# Patient Record
Sex: Male | Born: 1955 | Race: White | Hispanic: No | Marital: Married | Smoking: Never smoker
Health system: Southern US, Community
[De-identification: ages and names within clinical notes are randomized; demographics above are authoritative.]

## PROBLEM LIST (undated history)

## (undated) DIAGNOSIS — I10 Essential (primary) hypertension: Secondary | ICD-10-CM

## (undated) DIAGNOSIS — M109 Gout, unspecified: Secondary | ICD-10-CM

## (undated) DIAGNOSIS — I2510 Atherosclerotic heart disease of native coronary artery without angina pectoris: Secondary | ICD-10-CM

## (undated) DIAGNOSIS — E782 Mixed hyperlipidemia: Secondary | ICD-10-CM

## (undated) DIAGNOSIS — G5603 Carpal tunnel syndrome, bilateral upper limbs: Secondary | ICD-10-CM

## (undated) DIAGNOSIS — M1990 Unspecified osteoarthritis, unspecified site: Secondary | ICD-10-CM

## (undated) HISTORY — PX: TRIGGER FINGER RELEASE: SHX641

## (undated) HISTORY — DX: Atherosclerotic heart disease of native coronary artery without angina pectoris: I25.10

## (undated) HISTORY — DX: Mixed hyperlipidemia: E78.2

## (undated) HISTORY — DX: Essential (primary) hypertension: I10

---

## 1975-03-29 HISTORY — PX: VASECTOMY: SHX75

## 2001-03-29 ENCOUNTER — Encounter: Payer: Self-pay | Admitting: Pediatrics

## 2001-03-29 ENCOUNTER — Inpatient Hospital Stay (HOSPITAL_COMMUNITY): Admission: EM | Admit: 2001-03-29 | Discharge: 2001-03-30 | Payer: Self-pay | Admitting: Emergency Medicine

## 2003-02-14 ENCOUNTER — Ambulatory Visit (HOSPITAL_COMMUNITY): Admission: RE | Admit: 2003-02-14 | Discharge: 2003-02-14 | Payer: Self-pay | Admitting: *Deleted

## 2004-03-10 ENCOUNTER — Ambulatory Visit: Payer: Self-pay | Admitting: Cardiology

## 2005-04-01 ENCOUNTER — Ambulatory Visit: Payer: Self-pay | Admitting: Cardiology

## 2006-03-09 ENCOUNTER — Ambulatory Visit: Payer: Self-pay | Admitting: Cardiology

## 2006-03-28 HISTORY — PX: ELBOW SURGERY: SHX618

## 2006-03-28 HISTORY — PX: OTHER SURGICAL HISTORY: SHX169

## 2006-05-25 ENCOUNTER — Ambulatory Visit: Payer: Self-pay | Admitting: Cardiology

## 2006-05-25 LAB — CONVERTED CEMR LAB
ALT: 38 units/L (ref 0–40)
AST: 29 units/L (ref 0–37)
Albumin: 4 g/dL (ref 3.5–5.2)
Alkaline Phosphatase: 65 units/L (ref 39–117)
Bilirubin, Direct: 0.1 mg/dL (ref 0.0–0.3)
Cholesterol: 119 mg/dL (ref 0–200)
HDL: 36.9 mg/dL — ABNORMAL LOW (ref >39.0)
LDL Cholesterol: 50 mg/dL (ref 0–99)
Total Bilirubin: 0.6 mg/dL (ref 0.3–1.2)
Total CHOL/HDL Ratio: 3.2
Total Protein: 6.9 g/dL (ref 6.0–8.3)
Triglycerides: 159 mg/dL — ABNORMAL HIGH (ref 0–149)
VLDL: 32 mg/dL (ref 0–40)

## 2007-03-15 ENCOUNTER — Ambulatory Visit: Payer: Self-pay | Admitting: Cardiology

## 2007-04-06 ENCOUNTER — Ambulatory Visit: Payer: Self-pay

## 2007-04-06 LAB — CONVERTED CEMR LAB
BUN: 12 mg/dL (ref 6–23)
CO2: 30 meq/L (ref 19–32)
Calcium: 10.2 mg/dL (ref 8.4–10.5)
Chloride: 103 meq/L (ref 96–112)
Creatinine, Ser: 1.1 mg/dL (ref 0.4–1.5)
GFR calc Af Amer: 90 mL/min
GFR calc non Af Amer: 74 mL/min
Glucose, Bld: 99 mg/dL (ref 70–99)
Potassium: 4.8 meq/L (ref 3.5–5.1)
Sodium: 141 meq/L (ref 135–145)

## 2007-09-25 ENCOUNTER — Ambulatory Visit: Payer: Self-pay | Admitting: Cardiology

## 2008-02-28 ENCOUNTER — Encounter: Payer: Self-pay | Admitting: Cardiology

## 2008-03-06 ENCOUNTER — Ambulatory Visit: Payer: Self-pay | Admitting: Cardiology

## 2008-08-07 ENCOUNTER — Encounter: Payer: Self-pay | Admitting: Cardiology

## 2008-10-02 ENCOUNTER — Ambulatory Visit: Payer: Self-pay | Admitting: Cardiology

## 2008-11-04 ENCOUNTER — Encounter (INDEPENDENT_AMBULATORY_CARE_PROVIDER_SITE_OTHER): Payer: Self-pay | Admitting: *Deleted

## 2008-12-17 DIAGNOSIS — E782 Mixed hyperlipidemia: Secondary | ICD-10-CM | POA: Insufficient documentation

## 2008-12-17 DIAGNOSIS — I2510 Atherosclerotic heart disease of native coronary artery without angina pectoris: Secondary | ICD-10-CM

## 2009-03-03 ENCOUNTER — Encounter: Payer: Self-pay | Admitting: Cardiology

## 2009-04-08 ENCOUNTER — Encounter: Payer: Self-pay | Admitting: Cardiology

## 2009-04-08 ENCOUNTER — Ambulatory Visit: Payer: Self-pay | Admitting: Cardiology

## 2009-04-08 DIAGNOSIS — I10 Essential (primary) hypertension: Secondary | ICD-10-CM | POA: Insufficient documentation

## 2009-10-19 ENCOUNTER — Ambulatory Visit: Payer: Self-pay | Admitting: Cardiology

## 2009-10-19 ENCOUNTER — Encounter (INDEPENDENT_AMBULATORY_CARE_PROVIDER_SITE_OTHER): Payer: Self-pay | Admitting: *Deleted

## 2010-03-04 ENCOUNTER — Encounter: Payer: Self-pay | Admitting: Cardiology

## 2010-03-05 ENCOUNTER — Encounter: Payer: Self-pay | Admitting: Cardiology

## 2010-03-05 ENCOUNTER — Encounter (INDEPENDENT_AMBULATORY_CARE_PROVIDER_SITE_OTHER): Payer: Self-pay | Admitting: *Deleted

## 2010-03-08 ENCOUNTER — Encounter: Payer: Self-pay | Admitting: Cardiology

## 2010-03-10 ENCOUNTER — Encounter: Payer: Self-pay | Admitting: Cardiology

## 2010-03-16 ENCOUNTER — Telehealth (INDEPENDENT_AMBULATORY_CARE_PROVIDER_SITE_OTHER): Payer: Self-pay | Admitting: *Deleted

## 2010-03-16 ENCOUNTER — Ambulatory Visit: Payer: Self-pay | Admitting: Cardiology

## 2010-03-16 ENCOUNTER — Encounter (INDEPENDENT_AMBULATORY_CARE_PROVIDER_SITE_OTHER): Payer: Self-pay | Admitting: *Deleted

## 2010-04-27 NOTE — Assessment & Plan Note (Signed)
Summary: 6 month fu   Visit Type:  Follow-up Primary Provider:  Dr. Doreen Beam   History of Present Illness: 55 year old male presents for a followup visit. He generally reports feeling well, without angina, or labored breathing. He has had less energy than usual over the last several months. He does attribute this somewhat to weight, also less regular exercise. He reports no other unusual stress with his job. We talked about his medication today, possibly decreasing his beta blocker. I also reviewed a return to regular exercise.  Labs obtained by Dr. Sherril Croon back in December revealed a BUN of 14, creatinine 1.1, sodium 139, potassium 4.7, AST 48, ALT 62, WBC 3.4, hemoglobin 15.3, platelets 131, cholesterol 142, triglycerides 165, HDL 41, LDL 68, TSH 3.6. I note his abnormal liver function tests. He tells me that he is due to have followup lab with Dr. Sherril Croon.    Preventive Screening-Counseling & Management  Alcohol-Tobacco     Smoking Status: never  Caffeine-Diet-Exercise     Does Patient Exercise: yes  Current Medications (verified): 1)  Vytorin 10-20 Mg Tabs (Ezetimibe-Simvastatin) .... Take One Tablet By Mouth Daily At Bedtime 2)  Benazepril Hcl 10 Mg Tabs (Benazepril Hcl) .... Take 1 Tablet By Mouth Once A Day 3)  Metoprolol Succinate 100 Mg Xr24h-Tab (Metoprolol Succinate) .... Take 1 Tablet By Mouth Once A Day 4)  Aspir-Trin 325 Mg Tbec (Aspirin) .... Take 1 Tablet By Mouth Once A Day 5)  Multivitamins  Tabs (Multiple Vitamin) .... Take 1 Tablet By Mouth Once A Day 6)  Fish Oil 1200 Mg Caps (Omega-3 Fatty Acids) .... Take 1 Tablet By Mouth Once A Day  Allergies (verified): No Known Drug Allergies  Comments:  Nurse/Medical Assistant: The patient's medications and allergies were reviewed with the patient and were updated in the Medication and Allergy Lists. Verbally gave names and doses.  Past History:  Social History: Last updated: 04/08/2009 Tobacco Use - No Regular  Exercise - yes Alcohol Use - yes (occasional)  Past Medical History: CAD - occluded RCA with collaterals 2004 Hyperlipidemia Hypothyroidism  Past Surgical History: Vasectomy Surgical repair of torn left medial meniscus  Family History: Family History of Coronary Artery Disease  Social History: Tobacco Use - No Regular Exercise - yes Alcohol Use - yes (occasional) Does Patient Exercise:  yes  Review of Systems  The patient denies anorexia, fever, weight loss, chest pain, syncope, peripheral edema, prolonged cough, headaches, hemoptysis, abdominal pain, melena, and hematochezia.         He has had some problems with "bursitis" affecting his right shoulder, and had an injection. Otherwise reviewed and negative except as outlined above.  Vital Signs:  Patient profile:   55 year old male Height:      70 inches Weight:      215 pounds BMI:     30.96 Pulse rate:   69 / minute BP sitting:   148 / 87  (left arm) Cuff size:   large  Vitals Entered By: Carlye Grippe (April 08, 2009 3:17 PM)  Nutrition Counseling: Patient's BMI is greater than 25 and therefore counseled on weight management options.   Physical Exam  Additional Exam:  Overweight male in no acute distress. HEENT: Conjunctiva and lids normal, oropharynx clear. Neck: Supple, no elevated jugular venous pressure, no bruits. Lungs: Clear to auscultation, nonlabored. Cardiac: Regular rate and rhythm, no S3, no significant murmur. Abdomen: Soft, nontender, no hepatomegaly, no bruits. Skin: Warm and dry. Musculoskeletal: No kyphosis. Extremities: No pitting  edema, distal pulses 2+. Neuropsychiatric: Alert and oriented x3, affect appropriate.   EKG  Procedure date:  04/08/2009  Findings:      Normal sinus rhythm at 65 beats per minute.  Impression & Recommendations:  Problem # 1:  CAD, NATIVE VESSEL (ICD-414.01)  Symptomatically stable on present medical regimen. I have encouraged a return to regular  exercise as well as attention to diet and weight loss. We did discuss potentially reducing his Toprol XL in half to see if this helps with his energy. He will see how exercise affects him first. His electrocardiogram is normal today. I will have him follow up in 6 months.  His updated medication list for this problem includes:    Benazepril Hcl 10 Mg Tabs (Benazepril hcl) .Marland Kitchen... Take 1 tablet by mouth once a day    Metoprolol Succinate 100 Mg Xr24h-tab (Metoprolol succinate) .Marland Kitchen... Take 1 tablet by mouth once a day    Aspir-trin 325 Mg Tbec (Aspirin) .Marland Kitchen... Take 1 tablet by mouth once a day  Orders: EKG w/ Interpretation (93000)  Problem # 2:  HYPERLIPIDEMIA-MIXED (ICD-272.4)  LDL is optimal on Vytorin, followed by Dr. Sherril Croon. He did have mildly increased AST and ALT. He does not drink alcohol regularly. He has had no other abdominal symptoms. Followup labs per Dr. Sherril Croon.  His updated medication list for this problem includes:    Vytorin 10-20 Mg Tabs (Ezetimibe-simvastatin) .Marland Kitchen... Take one tablet by mouth daily at bedtime  Problem # 3:  ESSENTIAL HYPERTENSION, BENIGN (ICD-401.1) Blood pressure is elevated today, although patient states that typically his systolic is in the "120 to 130 range." I asked him to keep an eye on this.  His updated medication list for this problem includes:    Benazepril Hcl 10 Mg Tabs (Benazepril hcl) .Marland Kitchen... Take 1 tablet by mouth once a day    Metoprolol Succinate 100 Mg Xr24h-tab (Metoprolol succinate) .Marland Kitchen... Take 1 tablet by mouth once a day    Aspir-trin 325 Mg Tbec (Aspirin) .Marland Kitchen... Take 1 tablet by mouth once a day  Patient Instructions: 1)  You may decrease Toprol (metoprolol) by half as discussed with Dr. Diona Browner if needed. 2)  Your physician wants you to follow-up in: 6 months. You will receive a reminder letter in the mail one-two months in advance. If you don't receive a letter, please call our office to schedule the follow-up appointment.

## 2010-04-27 NOTE — Letter (Signed)
Summary: Pharmacist, community at [facility]. [address]   [place], [place] [address]   Phone: [phone]  Fax: [phone]      [facility] Services  Cardiolite Stress Test     [facility]  Your doctor has ordered a Cardiolite Stress Test to help determine the condition of your heart during stress. If you take blood pressure medicine, ask your doctor if you should take it the day of your test. You should not have anything to eat or drink at least 4 hours before your test is scheduled, and no caffeine (coffee, tea, decaf. or chocolate) for 24 hours before your test.   You will need to register at the Outpatient/Main Entrance at the hospital 30 minutes before your appointment time. It is a good idea to bring a copy of your order with you. They will direct you to the Diagnostic Imaging (Radiology) Department.  You will be asked to undress from the waist up and be given a hospital gown to wear, so dress comfortably from the waist down, for example:    Sweat pants, shorts or skirt   Rubber-soled lace up shoes (i.e. tennis shoes)  Plan on about three hours from registration to release from the hospital.    Date of Test:              Time of Test              Do test in 6 months before your next office visit around January 2012.   Hold Metoprolol for 24 hours before test.

## 2010-04-27 NOTE — Letter (Signed)
Summary: Pharmacist, community at [facility]. [address]   [place], [place] [address]   Phone: [phone]  Fax: [phone]      [facility] Cardiovascular Services  Cardiolite Stress Test     [facility]  Your doctor has ordered a Cardiolite Stress Test to help determine the condition of your heart during stress. If you take blood pressure medicine, ask your doctor if you should take it the day of your test. You should not have anything to eat or drink at least 4 hours before your test is scheduled, and no caffeine (coffee, tea, decaf. or chocolate) for 24 hours before your test.  You will need to register at the Outpatient/Main Entrance at the hospital 30 minutes before your appointment time. It is a good idea to bring a copy of your order with you. They will direct you to the Diagnostic Imaging (Radiology) Department.  You will be asked to undress from the waist up and be given a hospital gown to wear, so dress comfortably from the waist down, for example:    Sweat pants, shorts or skirt   Rubber-soled lace up shoes (i.e. tennis shoes)  Plan on about three hours from registration to release from the hospital.    Date of Test:  MONDAY DEC, 19, 2011           Time of Test  0830-ARRIVE AT 8AM FOR REGISTRATION   Hold Metoprolol 24 hours prior to test. You may take your remaining medications with water the morning of your test.

## 2010-04-27 NOTE — Miscellaneous (Signed)
Summary: STRESS CARDIOLITE  Clinical Lists Changes  Orders: Added new Referral order of Nuclear Med (Nuc Med) - Signed

## 2010-04-27 NOTE — Assessment & Plan Note (Signed)
Summary: 6 MO FU   Visit Type:  Follow-up Primary Provider:  Dr. Doreen Beam   History of Present Illness: 55 year old male presents for followup. He reports doing well without any significant angina or breathlessness. Continues to exercise regularly. He does report improvement in his energy level on reduced dose metoprolol.  He will be seeing Dr. Sherril Croon for routine physical with labs later in the year. We can review his cholesterol numbers at that time.  He has not had a recent followup ischemic evaluation. We discussed arranging a stress test before his next visit.  Preventive Screening-Counseling & Management  Alcohol-Tobacco     Smoking Status: never  Current Medications (verified): 1)  Vytorin 10-20 Mg Tabs (Ezetimibe-Simvastatin) .... Take One Tablet By Mouth Daily At Bedtime 2)  Benazepril Hcl 10 Mg Tabs (Benazepril Hcl) .... Take 1 Tablet By Mouth Once A Day 3)  Metoprolol Succinate 100 Mg Xr24h-Tab (Metoprolol Succinate) .... Take 1/2 Tablet By Mouth Once A Day 4)  Aspir-Trin 325 Mg Tbec (Aspirin) .... Take 1 Tablet By Mouth Once A Day 5)  Multivitamins  Tabs (Multiple Vitamin) .... Take 1 Tablet By Mouth Once A Day 6)  Fish Oil 1200 Mg Caps (Omega-3 Fatty Acids) .... Take 1 Tablet By Mouth Once A Day  Allergies (verified): No Known Drug Allergies  Comments:  Nurse/Medical Assistant: The patient's medication list and allergies were reviewed with the patient and were updated in the Medication and Allergy Lists.  Past History:  Past Medical History: Last updated: 04/08/2009 CAD - occluded RCA with collaterals 2004 Hyperlipidemia Hypothyroidism  Social History: Last updated: 04/08/2009 Tobacco Use - No Regular Exercise - yes Alcohol Use - yes (occasional)  Review of Systems  The patient denies anorexia, fever, chest pain, syncope, dyspnea on exertion, peripheral edema, melena, and hematochezia.         Otherwise reviewed and negative.  Vital Signs:  Patient  profile:   55 year old male Height:      70 inches Weight:      218 pounds Pulse rate:   68 / minute BP sitting:   130 / 84  (left arm) Cuff size:   large  Vitals Entered By: Carlye Grippe (October 19, 2009 3:14 PM)  Physical Exam  Additional Exam:  Overweight male in no acute distress. HEENT: Conjunctiva and lids normal, oropharynx clear. Neck: Supple, no elevated jugular venous pressure, no bruits. Lungs: Clear to auscultation, nonlabored. Cardiac: Regular rate and rhythm, no S3, no significant murmur. Abdomen: Soft, nontender, no hepatomegaly, no bruits. Skin: Warm and dry. Musculoskeletal: No kyphosis. Extremities: No pitting edema, distal pulses 2+. Neuropsychiatric: Alert and oriented x3, affect appropriate.   Cardiac Cath  Procedure date:  02/14/2003  Findings:      RESULTS:  Aortic pressure 142/86 with a mean arterial pressure of 112 mmHg.   Left ventricular pressure 142/10 with an end diastolic pressure of 17 mmHg.   Selective coronary angiography:  The left  main coronary artery is a large  vessel, which is angiographically unremarkable.   The left anterior descending coronary artery is a moderate caliber vessel  with a 25% stenosis in its proximal portion. There was diffuse disease  throughout the proximal to mid portion, but not any occlusive disease or  obstructive disease. He has four, small, diagonal branches which have  luminal irregularities.   The circumflex coronary artery is a large vessel, which has a number of  small obtuse marginals and a large posterior lateral branch. It bifurcates  along the inferior lateral wall. There is a 50% lesion in the mid portion of  the obtuse marginal.   The right coronary artery is a large, dominant vessel, which is occluded in  its proximal portion. It is seen to fill via right to right and left to  right collaterals. It is a relatively large, dominant vessel with a moderate  caliber posterior descending coronary  artery disease.   Left ventriculography reveals normal left ventricular systolic function with  no wall motion abnormalities and estimated ejection fraction of 60% with no  mitral regurgitation.  EKG  Procedure date:  10/19/2009  Findings:      Normal sinus rhythm at 66 beats per minute.  Impression & Recommendations:  Problem # 1:  CAD, NATIVE VESSEL (ICD-414.01)  Symptomatically stable on present medical regimen, with better energy on reduced dose metoprolol. I recommend continued exercise and symptom surveillance. We will arrange a followup exercise Cardiolite prior to his followup visit in 6 months.  His updated medication list for this problem includes:    Benazepril Hcl 10 Mg Tabs (Benazepril hcl) .Marland Kitchen... Take 1 tablet by mouth once a day    Metoprolol Succinate 100 Mg Xr24h-tab (Metoprolol succinate) .Marland Kitchen... Take 1/2 tablet by mouth once a day    Aspir-trin 325 Mg Tbec (Aspirin) .Marland Kitchen... Take 1 tablet by mouth once a day  Orders: EKG w/ Interpretation (93000)  Problem # 2:  HYPERLIPIDEMIA-MIXED (ICD-272.4)  Will review labs when obtained later at physical with Dr. Sherril Croon.  His updated medication list for this problem includes:    Vytorin 10-20 Mg Tabs (Ezetimibe-simvastatin) .Marland Kitchen... Take one tablet by mouth daily at bedtime  Problem # 3:  ESSENTIAL HYPERTENSION, BENIGN (ICD-401.1)  Blood pressure reasonably well controlled today.  His updated medication list for this problem includes:    Benazepril Hcl 10 Mg Tabs (Benazepril hcl) .Marland Kitchen... Take 1 tablet by mouth once a day    Metoprolol Succinate 100 Mg Xr24h-tab (Metoprolol succinate) .Marland Kitchen... Take 1/2 tablet by mouth once a day    Aspir-trin 325 Mg Tbec (Aspirin) .Marland Kitchen... Take 1 tablet by mouth once a day  Patient Instructions: 1)  Your physician has requested that you have an exercise stress cardiolite.  For further information please visit [email or web address]  Please follow instruction sheet, as given. 2)  Your physician  recommends that you continue on your current medications as directed. Please refer to the Current Medication list given to you today. 3)  Your physician wants you to follow-up in: 6 months. You will receive a reminder letter in the mail one-two months in advance. If you don't receive a letter, please call our office to schedule the follow-up appointment.

## 2010-04-29 NOTE — Letter (Signed)
Summary: Lexiscan or Dobutamine Pharmacist, community at [facility]  [address]   [place], [place] [address]   Phone: [phone]  Fax: [phone]      [facility] Cardiovascular Services  Lexiscan or Dobutamine Cardiolite Strss Test    [facility]  Appointment Date:_  Appointment Time:_  Your doctor has ordered a CARDIOLITE STRESS TEST using a medication to stimulate exercise so that you will not have to walk on the treadmill to determine the condition of your heart during stress. If you take blood pressure medication, ask your doctor if you should take it the day of your test. You should not have anything to eat or drink at least 4 hours before your test is scheduled, and no caffeine, including decaffeinated tea and coffee, chocolate, and soft drinks for 24 hours before your test.  You will need to register at the Outpatient/Main Entrance at the hospital 15 minutes before your appointment time. It is a good idea to bring a copy of your order with you. They will direct you to the Diagnostic Imaging (Radiology) Department.  You will be asked to undress from the waist up and given a hospital gown to wear, so dress comfortably from the waist down for example: Sweat pants, shorts, or skirt Rubber soled lace up shoes (tennis shoes)  Plan on about three hours from registration to release from the hospital  You may take medications with water the morning of your test.

## 2010-04-29 NOTE — Assessment & Plan Note (Signed)
Summary: 6 mo fu   Visit Type:  Follow-up Primary Minnetta Sandora:  Dr. Doreen Beam   History of Present Illness: 55 year old male presents for follow-up. He was last seen in the office back in July. Followup stress testing was anticipated, however canceled related to a recent hospitalization related to what sounds like fevers with a viral illness, possibly pneumonia. He is still recovering, slowly improving appetite, still feels weak.  He otherwise reports compliance with medications, has had no angina.  Lab work from 8 December showed BUN 14, creatinine 1.3, potassium 3.7, AST 25, ALT 30, troponin I 0.01, hemoglobin 11.9, platelet 119.  Preventive Screening-Counseling & Management  Alcohol-Tobacco     Smoking Status: never  Current Medications (verified): 1)  Vytorin 10-20 Mg Tabs (Ezetimibe-Simvastatin) .... Take One Tablet By Mouth Daily At Bedtime 2)  Benazepril Hcl 10 Mg Tabs (Benazepril Hcl) .... Take 1 Tablet By Mouth Once A Day 3)  Metoprolol Succinate 100 Mg Xr24h-Tab (Metoprolol Succinate) .... Take 1/2 Tablet By Mouth Once A Day 4)  Aspir-Trin 325 Mg Tbec (Aspirin) .... Take 1 Tablet By Mouth Once A Day 5)  Multivitamins  Tabs (Multiple Vitamin) .... Take 1 Tablet By Mouth Once A Day 6)  Fish Oil 1200 Mg Caps (Omega-3 Fatty Acids) .... Take 1 Tablet By Mouth Once A Day  Allergies (verified): No Known Drug Allergies  Comments:  Nurse/Medical Assistant: The patient's medications and allergies were verbally reviewed with the patient and were updated in the Medication and Allergy Lists.  Past History:  Past Medical History: Last updated: 04/08/2009 CAD - occluded RCA with collaterals 2004 Hyperlipidemia Hypothyroidism  Social History: Last updated: 04/08/2009 Tobacco Use - No Regular Exercise - yes Alcohol Use - yes (occasional)  Review of Systems  The patient denies fever, weight gain, chest pain, syncope, dyspnea on exertion, peripheral edema, melena, and  hematochezia.         Otherwise reviewed and negative except as outlined.  Vital Signs:  Patient profile:   55 year old male Height:      70 inches Weight:      206 pounds BMI:     29.66 Pulse rate:   90 / minute BP sitting:   124 / 79  (left arm) Cuff size:   large  Vitals Entered By: Carlye Grippe (March 16, 2010 3:34 PM)  Nutrition Counseling: Patient's BMI is greater than 25 and therefore counseled on weight management options.  Physical Exam  Additional Exam:  Normally nourished appearing male in no acute distress. HEENT: Conjunctiva and lids normal, oropharynx clear. Neck: Supple, no elevated jugular venous pressure, no bruits. Lungs: Clear to auscultation, nonlabored. Cardiac: Regular rate and rhythm, no S3, no significant murmur. Abdomen: Soft, nontender, no hepatomegaly, no bruits. Skin: Warm and dry. Extremities: No pitting edema, distal pulses 2+. Neuropsychiatric: Alert and oriented x3, affect appropriate.   Impression & Recommendations:  Problem # 1:  CAD, NATIVE VESSEL (ICD-414.01)  Hold off on surveillance stress testing until his next visit in 6 months. Continue medical therapy. Symptomatically stable at this point.  His updated medication list for this problem includes:    Benazepril Hcl 10 Mg Tabs (Benazepril hcl) .Marland Kitchen... Take 1 tablet by mouth once a day    Metoprolol Succinate 100 Mg Xr24h-tab (Metoprolol succinate) .Marland Kitchen... Take 1/2 tablet by mouth once a day    Aspir-trin 325 Mg Tbec (Aspirin) .Marland Kitchen... Take 1 tablet by mouth once a day  Problem # 2:  ESSENTIAL HYPERTENSION, BENIGN (ICD-401.1)  Blood  pressure control is good today.  His updated medication list for this problem includes:    Benazepril Hcl 10 Mg Tabs (Benazepril hcl) .Marland Kitchen... Take 1 tablet by mouth once a day    Metoprolol Succinate 100 Mg Xr24h-tab (Metoprolol succinate) .Marland Kitchen... Take 1/2 tablet by mouth once a day    Aspir-trin 325 Mg Tbec (Aspirin) .Marland Kitchen... Take 1 tablet by mouth once a  day  Problem # 3:  HYPERLIPIDEMIA-MIXED (ICD-272.4)  Continues on Vytorin, followed by Dr. Sherril Croon most recently.  His updated medication list for this problem includes:    Vytorin 10-20 Mg Tabs (Ezetimibe-simvastatin) .Marland Kitchen... Take one tablet by mouth daily at bedtime  Patient Instructions: 1)  Your physician wants you to follow-up in: 6 months. You will receive a reminder letter in the mail one-two months in advance. If you don't receive a letter, please call our office to schedule the follow-up appointment. 2)  Your physician has requested that you have an Lexiscan cardiolite.  For further information please visit [email or web address]  Please follow instruction sheet, as given. DO THIS BEFORE NEXT OFFICE VISIT.

## 2010-04-29 NOTE — Progress Notes (Signed)
Summary: Cancel Stress test (from 03/15/10)  Phone Note Other Incoming   Summary of Call: Pt was scheduled for Nuclear stress test on 12/19. He has been in the hosptial d/t the flu. Pt was told by primary MD to hold off on stress test for now.  Initial call taken by: Cyril Loosen, RN, BSN,  March 16, 2010 10:53 AM

## 2010-07-12 ENCOUNTER — Telehealth: Payer: Self-pay | Admitting: Cardiology

## 2010-07-12 ENCOUNTER — Other Ambulatory Visit: Payer: Self-pay | Admitting: Cardiology

## 2010-07-12 DIAGNOSIS — I2510 Atherosclerotic heart disease of native coronary artery without angina pectoris: Secondary | ICD-10-CM

## 2010-07-12 NOTE — Telephone Encounter (Signed)
Stress Cardiolite scheduled for 08-20-2010 and patient notified

## 2010-08-10 NOTE — Assessment & Plan Note (Signed)
[facility]                            CARDIOLOGY OFFICE NOTE   BRANDI, ARMATO                 MRN:          [id number]  DATE:09/25/2007                            DOB:          Oct 08, 1955    PRIMARY CARE PHYSICIAN:  Doreen Beam, MD   REASON FOR VISIT:  Cardiac followup.   HISTORY OF PRESENT ILLNESS:  Ricky Wilson is doing well.  I saw him back  in December 2008.  He has known coronary artery disease with an occluded  right coronary artery that fills via collaterals.  He had a followup  exercise Myoview in January, 2009, demonstrating normal left ventricular  systolic function of 59%, with no diagnostic electrocardiographic  changes to indicate ischemia, and a very slight area of ischemia at the  inferior wall by perfusion imaging.  I felt that this was low risk and  able to be managed medically.  In fact,  Ricky Wilson does have some  walkthrough angina at increased levels of activity, but this has been  very stable, and he is actually fairly pleased with his progress.  His  electrogram today is normal, showing sinus rhythm at 60 beats per  minute.  He states that his cholesterol has been in good order and  follows with Dr. Sherril Croon.  Today, we talked about being more regular with  exercise and, otherwise, I would plan to see him back over the next 6  months.   ALLERGIES:  No known drug allergies.   PRESENT MEDICATIONS:  1. Aspirin 325 mg p.o. daily.  2. Multivitamin 1 p.o. daily.  3. Toprol XL 100 mg p.o. daily.  4. Omega-3 supplements.  5. Vytorin 10/20 mg p.o. nightly.  6. Benazepril 10 mg p.o. daily.   REVIEW OF SYSTEMS:  As described in History of Present Illness.   PHYSICAL EXAMINATION:  Blood pressure is 118/78, heart rate is 62.  The patient is comfortable and in no acute distress.  NECK:  No elevated jugular venous pressure.  No loud bruits.  LUNGS:  Clear without labored breathing.  CARDIAC:  Regular rate and rhythm.  No loud  murmur or gallop.  EXTREMITIES:  No pitting edema.   IMPRESSION AND RECOMMENDATIONS:  Coronary artery disease involving an  occluded right coronary artery that fills via collaterals; followup  Myoview in January 2009 demonstrated very slight inferior ischemia,  which would be consistent with this, and is overall low risk.  He has  reasonable symptom control on medications and is doing well otherwise.  I will plan to see him back in 6 months in [place], and he will otherwise  continue to follow with Dr. Sherril Croon.     Ricky Sidle, MD  Electronically Signed    SGM/MedQ  DD: 09/25/2007  DT: 09/26/2007  Job #: [id number]   cc:   Doreen Beam, MD

## 2010-08-10 NOTE — Assessment & Plan Note (Signed)
[facility]                          [facility] NOTE   SHAWNDALE, Wilson                 MRN:          [id number]  DATE:10/02/2008                            DOB:          [birth year]    PRIMARY CARE PHYSICIAN:  Ricky Beam, MD   REASON FOR VISIT:  Routine followup.   HISTORY OF PRESENT ILLNESS:  Mr. Ricky Wilson returns for a 78-month visit.  He  is having no exertional angina.  He states that he has been exercising  regularly now, nearly 5 days a week.  His weight is down approximately 7  pounds of his last visit and has also been trying to pay attention to  his diet, a bit  more carefully.  He will be due for followup lab work  with Dr. Sherril Wilson at his next complete physical.  Medical therapy continues  as outlined below and denies any major problems with any of his  medications.  He has been taking his aspirin in the evenings.   ALLERGIES:  No known drug allergies.   MEDICATIONS:  1. Aspirin 325 mg p.o. nightly.  2. Multivitamin 1 p.o. daily.  3. Omega-3 supplements 1200 mg p.o. daily.  4. Vytorin 10/20 mg p.o. daily.  5. Benazepril 10 mg p.o. daily.  6. Metoprolol 100 mg p.o. daily.   REVIEW OF SYSTEMS:  As outlined above.  He has had some arthritic  sounding pain and his PIP joints usually in the mornings with stiffness,  which improves as the day goes on.  No orthopnea, PND, palpitations, or  lower extremity edema.  No claudication.  Otherwise reviewed and are  negative.   PHYSICAL EXAMINATION:  VITAL SIGNS:  Blood pressure is 134/78, heart  rate is 60, and weight is 212 pounds.  GENERAL:  The patient is comfortable in no acute distress.  HEENT:  Conjunctiva is normal.  Oropharynx is clear.  NECK:  Supple.  No elevated jugular venous pressure.  No loud bruits or  thyromegaly.  LUNGS:  Clear without labored breathing at rest.  CARDIAC:  Regular rate and rhythm.  no murmur, rub, or gallop.  EXTREMITIES:  Exhibit no significant  pitting edema.  No hand swelling  noted.   IMPRESSION AND RECOMMENDATIONS:  1. Coronary artery disease with a chronically occluded right coronary      artery with collateral filling and overall normal left ventricular      systolic function.  Symptomatically, he is stable without any      progressive anginal medical therapy.  I recommended that he      continue his regular exercise regimen and medical therapy and we      will see him back over the next 6 months.  2. Hyperlipidemia, on Vytorin.  LDL has been at goal.  He will be due      for followup labs with Dr. Sherril Wilson at his next complete physical.     Jonelle Sidle, MD  Electronically Signed    SGM/MedQ  DD: 10/02/2008  DT: 10/03/2008  Job #: [id number]   cc:   Ricky Beam, MD

## 2010-08-10 NOTE — Assessment & Plan Note (Signed)
[facility]                            CARDIOLOGY OFFICE NOTE   ZEPH, RIEBEL                 MRN:          [id number]  DATE:03/15/2007                            DOB:          08/02/1955    PRIMARY CARE PHYSICIAN:  Dr. Kirstie Peri.   REASON FOR VISIT:  Cardiac followup.   HISTORY OF PRESENT ILLNESS:  Ricky Wilson comes in for a routine visit.  He was seen last December.  He is not reporting any progressive  symptoms.  He does have some transient anginal chest pain with increased  levels of activity, although this pattern has not changed over the last  several years.  His electrocardiogram today is normal showing sinus  rhythm at 78 beats per minute.  He had recent blood work done this month  showing good LDL control at 70, HDL of 42, and total cholesterol at 143.  Liver function tests were normal as well.  I note that his blood  pressure remains mildly elevated.  Today, we talked about medication  adjustments and also a followup Myoview.   ALLERGIES:  No known drug allergies.   PRESENT MEDICATIONS:  1. Aspirin 325 mg p.o. daily.  2. Toprol XL 100 mg p.o. daily.  3. Multivitamin 1 p.o. daily.  4. Omega 3 supplements.  5. Vytorin 10/20 mg p.o. nightly.  6. Glucosamine chondroitin as directed.   REVIEW OF SYSTEMS:  As described in the history of present illness.  No  palpitations or syncope.   EXAMINATION:  Blood pressure is 143/80, heart rate is 76, weight is 216  pounds.  Mildly overweight male in no acute distress.  HEENT:  Conjunctivae and lids normal.  Oropharynx clear.  NECK:  Supple.  No elevated jugular venous pressure.  No loud bruits.  No thyromegaly was noted.  LUNGS:  Clear without labored breathing.  CARDIAC:  Regular rate and rhythm.  No S3 gallop or pericardial rub.  ABDOMEN:  Soft and nontender.  Normoactive bowel sounds.  No bruits.  EXTREMITIES:  No pitting edema.  Distal pulses are 2+.  SKIN:  Warm and dry.  MUSCULOSKELETAL:  No kyphosis noted.  NEURO/PSYCH:  The patient is alert and oriented x3.   IMPRESSION/RECOMMENDATIONS:  1. Single-vessel obstructive coronary artery disease with an occluded      right coronary artery that fills via collateral.  No other major      obstructive disease in the left system based on catheterization in      2004.  The patient is symptomatically stable.  We will plan a      followup surveillance exercise Myoview on medical therapy,      including beta blockers (symptom limited), and also will place him      on Altace 5 mg daily for better blood pressure management.  He will      need a followup BMET in 2 weeks.  Otherwise, he will continue his      remaining medications, and I will plan to see him back in 6 months.  2. Continued followup with Dr. Sherryll Burger.     Ricky Sidle, MD  Electronically Signed    SGM/MedQ  DD: 03/15/2007  DT: 03/15/2007  Job #: [id number]   cc:   Kirstie Peri, MD

## 2010-08-10 NOTE — Assessment & Plan Note (Signed)
[facility]                          [facility] NOTE   Ricky Wilson, Ricky Wilson                 MRN:          [id number]  DATE:03/06/2008                            DOB:          1955-09-12    PRIMARY CARE PHYSICIAN:  Doreen Beam, MD   REASON FOR VISIT:  Scheduled followup.   HISTORY OF PRESENT ILLNESS:  I last saw Ricky Wilson back in June.  He  denies having any problems with exertional angina or shortness of  breath.  He has not been exercising regularly, and we did talk about  getting back to an exercise regimen with a goal of weight loss, perhaps  starting at 10-15 pounds.  His electrocardiogram today is stable showing  sinus rhythm at 65 beats per minute.  He had a recent physical with Dr.  Sherril Croon and had lab work showing normal AST and ALT with an LDL of 61, HDL  of 41, triglycerides of 215, and a total cholesterol of 145.  He has  been tolerating Vytorin fairly well.  Otherwise, his medications are  stable.   ALLERGIES:  No known drug allergies.   INDICATIONS:  1. Aspirin 325 mg p.o. daily.  2. Multivitamin 1 p.o. daily.  3. Omega-3 supplements 1200 mg p.o. daily.  4. Vytorin 10/20 mg p.o. daily.  5. Benazepril 10 mg p.o. daily.  6. Metoprolol 100 mg p.o. daily.  7. Tylenol p.r.n.   REVIEW OF SYSTEMS:  As outlined above.  He does have a very focal area  of right lower leg soreness that sounds musculoskeletal in description,  much less likely avascular.  He is not reporting clear claudication  symptoms.  No significant lower extremity edema.  No palpitations or  syncope.  Otherwise, negative.   PHYSICAL EXAMINATION:  VITAL SIGNS:  Blood pressure is 133/81, heart  rate is 61, weight is 219 pounds.  GENERAL:  The patient is overweight in no acute distress.  NECK:  no elevated jugular venous pressure.  No loud bruits.  No  thyromegaly.  LUNGS:  Clear without labored breathing at rest.  CARDIAC:  A regular rate and rhythm.  No  rub, murmur, or gallop.  ABDOMEN:  Soft, nontender, normoactive bowel sounds.  EXTREMITIES:  No significant pitting edema.  Distal pulses are 2+.  SKIN:  Warm and dry.  MUSCULOSKELETAL:  No kyphosis noted.   IMPRESSION AND RECOMMENDATIONS:  1. Coronary artery disease with known chronically occluded right      coronary artery with collateral filling associated with overall      normal ventricle systolic function most recently assessed at 59%      and a very slight area of ischemia in the inferior wall by      perfusion imaging in January, absent any significant angina on      medical therapy.  Our plan will be to continue his present      medications.  I have recommended an exercise regiment and a goal of      weight loss.  He will plan to see [place] back over the next 6 months.  2. Hyperlipidemia, tolerating Vytorin, and  with LDL at goal.  Liver      function tests were normal.     Jonelle Sidle, MD  Electronically Signed    SGM/MedQ  DD: 03/06/2008  DT: 03/07/2008  Job #: [id number]   cc:   Doreen Beam, MD

## 2010-08-13 NOTE — H&P (Signed)
[place]  Patient:    Ricky Wilson Visit Number: [id number] MRN: [id number]          Service Type: MED Location: [id number] Attending Physician:  Nelta Numbers Dictated by:   Madolyn Frieze. Jens Som, M.D. [facility] Admit Date:  03/29/2001                           History and Physical  CHIEF COMPLAINT: Ricky Wilson is a very pleasant 55 year old gentleman, who presents with palpitations and chest pain.  HISTORY OF PRESENT ILLNESS: He has been seen by Dr. Dietrich Wilson previously, approximately nine to ten years ago, secondary to PVCs.  Work-up at that time apparently was negative.  He refrained from caffeine and his symptoms resolved.  Approximately one month ago the patient was climbing stairs and he developed sudden onset palpitations associated with chest tightness.  There was no shortness of breath, nausea, vomiting, or diaphoresis.  The episode lasted approximately 30 minutes and then resolved.  He has been asymptomatic since that time until today when he was climbing stairs and again developed palpitations associated with mild chest tightness.  There was no diaphoresis, shortness of breath, nausea, or vomiting.  He was seen by EMS and rhythm strips appeared to show atrial fibrillation.  This resolved prior to arrival at the emergency room.  He is now asymptomatic.  Of note, he does not have exertional chest pain and there is no dyspnea on exertion, orthopnea, PND, pedal edema, or syncope.  He has had no recent weight loss.  There is no heat intolerance.  PAST MEDICAL HISTORY/SOCIAL HISTORY/FAMILY HISTORY/REVIEW OF SYSTEMS: For details please refer to the note handwritten by Ricky Wilson, P.A.  CARDIAC RISK FACTORS:  1. Hypertension.  2. Positive family history.  There is no diabetes mellitus, hyperlipidemia, or tobacco use.  He has had a recent URI and has received Allegra, but has taken this before.  PHYSICAL  EXAMINATION:  VITAL SIGNS: Blood pressure 146/98, pulse 115 and regular, respiratory rate 20.  He is afebrile.  GENERAL: He is well-developed, well-nourished and in no acute distress.  SKIN: Warm and dry.  HEENT: Unremarkable with normal eyelids.  NECK: Supple, no bruits noted.  No jugular venous distention and no thyromegaly noted.  CHEST: Clear to auscultation with normal expansion.  CARDIOVASCULAR: Regular rate and rhythm.  Normal S1 and S2.  No murmurs, rubs, or gallops noted.  ABDOMEN: Nontender, nondistended.  Positive bowel sounds.  No hepatosplenomegaly.  No masses appreciated.  No abdominal bruit.  EXTREMITIES: He has 2+ femoral pulses bilaterally and no bruits.  Extremities show no edema and I can palpate no cords.  He has 2+ dorsalis pedis pulses bilaterally.  NEUROLOGIC: Grossly intact.  LABORATORY DATA: Sodium 136, potassium 3.4, BUN 15, chloride 103, creatinine 1.0.  WBC 4.7, hemoglobin 13.1, hematocrit 39.1; platelet count 334,000.  CK 117, MB 1.3.  Troponin I negative at 0.02.  Chest x-ray shows no acute disease.  Electrocardiogram shows a sinus tachycardia with rate of 100, axis normal, minor nonspecific ST changes in the inferior leads; no delta wave noted.  Review of his rhythm strips shows probable atrial fibrillation with rapid ventricular response.  DIAGNOSES:  1. Probable paroxysmal atrial fibrillation versus supraventricular     tachycardia.  2. Chest pain.  3. Hypertension.  PLAN: Ricky Wilson presents for evaluation of chest pain.  His symptoms occurred in the setting of palpitations.  We will admit  him and rule out myocardial infarction with serial enzymes.  If negative we will proceed with a stress Cardiolite tomorrow.  We will also check an echocardiogram for left ventricular function as well as a TSH.  Review of his rhythm strips shows probable atrial fibrillation.  We will add Lopressor for rate control.  If he has recurrence in the future  then he would most likely need anticoagulation, although I think this is not indicated at this point. Dictated by:   Madolyn Frieze. Jens Som, M.D. [facility] Attending Physician:  Nelta Numbers DD:  03/29/01 TD:  03/30/01 Job: [id number]

## 2010-08-13 NOTE — Assessment & Plan Note (Signed)
[facility]ALTHCARE                            CARDIOLOGY OFFICE NOTE   [name], [name]                 MRN:          [id number]  DATE:03/09/2006                            DOB:          02/09/1956    PRIMARY CARE PHYSICIAN:  Dr. Doreen Beam   REASON FOR VISIT:  Routine cardiac followup.   HISTORY OF PRESENT ILLNESS:  Mr. Ricky Wilson was last seen up in our [place]  office back in January 2007.  He has a history of single-vessel  obstructive coronary artery disease with collaterals and has done well  symptomatically.  He exercises 5 days a week doing a mile on his  treadmill and 2 miles on a stationary bicycle, as well as other weight  workouts.  He has been tolerating his medicines well.  Recent blood work  indicates triglycerides of 241, total cholesterol 193, HDL 43, and LDL  102.  We discussed statin therapy again today.  His electrocardiogram  shows sinus bradycardia at 54 beats per minute and otherwise no acute  changes.  He is not having any limiting dyspnea on exertion, exertional  chest pain, palpitations, orthopnea, PND, lower extremity edema, or  claudication.   ALLERGIES:  No known drug allergies.   PRESENT MEDICATIONS:  1. Aspirin 325 mg p.o. daily.  2. Multivitamin one p.o. daily.  3. Toprol-XL 100 mg p.o. daily.  4. Fish oil 1000 mg p.o. daily.  5. Red yeast rice extract.   REVIEW OF SYSTEMS:  As described in the history of present illness.  Otherwise, negative.   EXAMINATION:  VITAL SIGNS:  Blood pressure today 138/78, heart rate is  54, weight is 215 pounds.  GENERAL:  The patient is comfortable, well-developed, in no acute  distress.  HEENT:  Conjunctivae and lids normal, oropharynx is clear.  NECK:  Supple without elevated jugular venous pressure or loud bruits.  No thyromegaly is noted.  LUNGS:  Clear without labored breathing.  CARDIAC:  Reveals a regular rate and rhythm without loud murmur or  gallop.  PMI is  nondisplaced.  ABDOMEN:  Soft, no bruits.  Bowel sounds are present.  EXTREMITIES:  Show no significant pitting edema.   IMPRESSION/RECOMMENDATION:  1. Single-vessel obstructive coronary artery disease with collateral      filling and preserved ejection fraction.  The patient is      symptomatically stable and we will plan to continue medical therapy      with yearly observation.  I encouraged him to continue his regular      exercise regimen.  2. Dyslipidemia as outlined above.  I have asked [name] to      increase his fish oil supplements to 1000 mg p.o. b.i.d.,      potentially aiming to a goal of 4000 mg daily as needed.  We also      discussed statin therapy again and he will plan to begin a low-dose      statin medication in lieu of red yeast rice.  He is going to check      with his prescription insurance [employer] about preparations covered  and we can proceed from there.  He will need a followup lipid panel      with liver function tests in 8 weeks.     Jonelle Sidle, MD  Electronically Signed    SGM/MedQ  DD: 03/09/2006  DT: 03/09/2006  Job #: [id number]   cc:   Doreen Beam

## 2010-08-13 NOTE — Op Note (Signed)
NAME:  Ricky Wilson, Ricky Wilson                    ACCOUNT NO.:  [id number]   MEDICAL RECORD NO.:  [id number]                   PATIENT TYPE:  OIB   LOCATION:  2899                                 FACILITY:  [facility]   PHYSICIAN:  Vida Roller, M.D.                DATE OF BIRTH:  04/12/1955   DATE OF PROCEDURE:  DATE OF DISCHARGE:                                 OPERATIVE REPORT   REFERRING PHYSICIAN:  Dr. Doyne Keel in [place], [place].   PRIMARY CARDIOLOGIST:  Jonelle Sidle, M.D. [facility]   HISTORY OF PRESENT ILLNESS:  Ricky Wilson is a 55 year old male with a history  of exertional angina for the last two years associated with some shortness  of breath, which worsened over the course of the last couple of weeks. He  underwent a perfusion scan, which showed an inferior defect with evidence of  ST segment depression when he exercised. He was referred for elective  cardiac catheterization.   PROCEDURES PERFORMED:  1. Left heart catheterization.  2. Selective coronary angiography.  3. Left ventriculography all via the right radial approach.   DESCRIPTION OF PROCEDURE:  After obtaining informed consent the patient was  brought to the cardiac catheterization lab in the fasting state. The patient  was prepped and draped in the usual sterile manner. Local anesthetic was  obtained over the right wrist using 1% lidocaine without epinephrine. The  right radial artery was cannulated using the modified Seldinger technique  with a 6-French 10 cm sheath and a left heart catheterization was performed  using a 6-French Judkins left #4 and a 6-French Judkins right #4 and a 6-  French pigtail catheter. A pigtail catheter was used for a left  ventriculogram which was in the 30 degree RAO view. At the conclusion of the  procedure the catheters were removed. A RAD stat device was placed on the  wrist and the radial artery sheath was removed. Hemostasis was obtained  using the RAD stat device and distal  perfusion was checked using a pulse  oximetry unit. Photo fluoroscopic time was 9.2 minutes. Total contrast 100  cc.   RESULTS:  Aortic pressure 142/86 with a mean arterial pressure of 112 mmHg.   Left ventricular pressure 142/10 with an end diastolic pressure of 17 mmHg.   Selective coronary angiography:  The left  main coronary artery is a large  vessel, which is angiographically unremarkable.   The left anterior descending coronary artery is a moderate caliber vessel  with a 25% stenosis in its proximal portion. There was diffuse disease  throughout the proximal to mid portion, but not any occlusive disease or  obstructive disease. He has four, small, diagonal branches which have  luminal irregularities.   The circumflex coronary artery is a large vessel, which has a number of  small obtuse marginals and a large posterior lateral branch. It bifurcates  along the inferior lateral wall. There is a 50% lesion in  the mid portion of  the obtuse marginal.   The right coronary artery is a large, dominant vessel, which is occluded in  its proximal portion. It is seen to fill via right to right and left to  right collaterals. It is a relatively large, dominant vessel with a moderate  caliber posterior descending coronary artery disease.   Left ventriculography reveals normal left ventricular systolic function with  no wall motion abnormalities and estimated ejection fraction of 60% with no  mitral regurgitation.   ASSESSMENT:  Single vessel coronary artery disease. Normal left ventricular  function. My recommendation, due to the characteristics of the lesion is  medical therapy. If this fails then consideration may be made for an  attempted coronary revascularization, however, the success rate for this is  low.   A message was left with Dr. Doyne Keel' nurse regarding the results of his  catheterization.                                               Vida Roller, M.D.    JH/MEDQ   D:  02/14/2003  T:  02/15/2003  Job:  [id number]   cc:   Jonelle Sidle, M.D. [facility]

## 2010-08-13 NOTE — Discharge Summary (Signed)
[place]  Patient:    Ricky Wilson, Ricky Wilson Visit Number: [id number] MRN: [id number]          Service Type: Attending:  Gerrit Wilson. Ricky Wilson, M.D. [facility] Dictated by:   Ricky Wilson, P.A. Adm. Date:  03/29/01 Disc. Date: 03/30/01   CC:         Ricky Wilson   Referring Physician Discharge [place]  DATE OF BIRTH:  [birth year]  PROCEDURES: 1. Cardiolite. 2. A 2-D echocardiogram.  HOSPITAL COURSE:  Mr. Ricky Wilson is a 55 year old male with no known history of coronary artery disease who had been evaluated in the past for PVCs.  He had a history of approximately one-month episodic chest pain with strenuous exertion, and on the day of admission it started after climbing stairs and lasted about 10 minutes.  His blood pressure was 184/80 and the patient began walking again after the pain eased off, but it returned.  He was transported by EMS and in the ambulance it was noted that his heart rate was greater than 180.  The EKG was reviewed and he was felt to be in either SVT or atrial fibrillation with rapid ventricular response.  The patient was instructed on the Valsalva maneuver and his heart rate dropped to approximately 112 with the patient reporting improvement in symptoms.  After the Valsalva his rate was 120 to 125 with sinus tachycardia showing on the rhythm strips.  Upon arrival in the emergency room he was found to be in sinus rhythm/sinus tachycardia. He was admitted for further evaluation and treatment.  The strips were reviewed by Ricky Wilson, who felt that he was most likely in atrial fibrillation.  Because of this, he had a beta blocker added to his medication regimen and he was started on Coumadin.  He had a history of hypertension so the Lopressor was given to him instead the Altace he had been on prior to admission.  He was also hypokalemic with a potassium of 3.4 and this was supplemented.  To evaluate the possibility that ischemic heart  disease caused the rhythm problem, he had serial enzymes checked which were negative for MI, and then had a Cardiolite.  He had a stress Cardiolite and the results of the Cardiolite showed poor gating with an EF of 47%, but no defects.  He additionally had an echocardiogram which showed normal left ventricular size and function with an EF of 55-65% with no wall motion abnormalities.  There was mild asymmetric septal hypertrophy.  The aortic valve thickness was mildly increased and the left atrium was mildly dilated.  Because his Cardiolite was without ischemia, and because he had no wall motion abnormalities and his EF was within normal limits on echocardiogram, he was considered stable for discharge on March 30, 2001.  He was maintaining sinus rhythm at discharge.  LABORATORY DATA:  TSH 2.9.  Serial CK-MB and troponin I negative for MI.  DISCHARGE CONDITION:  Improved.  CONSULTS:  None.  COMPLICATIONS:  None.  DISCHARGE DIAGNOSES: 1. Tachycardia (atrial fibrillation with rapid ventricular response versus    supraventricular tachycardia) - sinus rhythm at discharge. 2. Anticoagulation secondary to possible atrial fibrillation. 3. Hypertension. 4. Seasonal allergies. 5. Family history of coronary artery disease. 6. Status post knee surgery for a torn left medial meniscus. 7. History of premature ventricular contractions. 8. Status post vasectomy.  DISCHARGE INSTRUCTIONS:  ACTIVITY:  His activity is not restricted.  DIET:  He is to stick to a low fat diet and limit  caffeine.  SPECIAL INSTRUCTIONS:  He is not to use any over-the-counter medication containing pseudoephedrine.  FOLLOW-UP:  He is to follow up with Ricky Wilson as scheduled.  He has an appointment at the Coumadin clinic on April 02, 2001 at 11 a.m.  He is to follow up with Ricky Wilson on May 16, 2001 at 4:15 p.m.  DISCHARGE MEDICATIONS: 1. Metoprolol 50 mg b.i.d. 2. Coumadin 5 mg q.d. 3. Allegra 180 mg  q.d. p.r.n. 4. Multivitamin q.d. 5. Benadryl 25-50 mg p.r.n. q.h.s.  He is not to take Altace for now. Dictated by:   Ricky Wilson, P.A. Attending:  Gerrit Wilson. Ricky Wilson, M.D. [facility] DD:  03/30/01 TD:  03/31/01 Job: [id number]

## 2010-08-20 DIAGNOSIS — I2510 Atherosclerotic heart disease of native coronary artery without angina pectoris: Secondary | ICD-10-CM

## 2010-09-01 ENCOUNTER — Encounter: Payer: Self-pay | Admitting: Cardiology

## 2010-09-01 ENCOUNTER — Ambulatory Visit: Payer: Self-pay | Admitting: Cardiology

## 2010-10-06 ENCOUNTER — Encounter: Payer: Self-pay | Admitting: Cardiology

## 2010-10-06 ENCOUNTER — Ambulatory Visit: Payer: Self-pay | Admitting: Cardiology

## 2010-10-06 ENCOUNTER — Ambulatory Visit (INDEPENDENT_AMBULATORY_CARE_PROVIDER_SITE_OTHER): Payer: 59 | Admitting: Physician Assistant

## 2010-10-06 DIAGNOSIS — I10 Essential (primary) hypertension: Secondary | ICD-10-CM

## 2010-10-06 DIAGNOSIS — I2510 Atherosclerotic heart disease of native coronary artery without angina pectoris: Secondary | ICD-10-CM

## 2010-10-06 DIAGNOSIS — E785 Hyperlipidemia, unspecified: Secondary | ICD-10-CM

## 2010-10-06 NOTE — Progress Notes (Signed)
Clinical Summary Ricky Wilson is a 55 y.o.male presenting for followup. He was seen in December 2011. Followup Cardiolite in May of this year demonstrated no active ischemia with LVEF 58%.  Clinically, he denies any interim development of CP or DOE. He reports improved stamina, following decrease in Toprol from 100 to 50 mg daily. He has requested a further decrease, if reasonable. He has also requested permission to engage in an intensive exercise program, PX 90.   No Known Allergies  Current outpatient prescriptions:aspirin 325 MG tablet, Take 325 mg by mouth daily.  , Disp: , Rfl: ;  benazepril (LOTENSIN) 10 MG tablet, Take 10 mg by mouth daily.  , Disp: , Rfl: ;  ezetimibe-simvastatin (VYTORIN) 10-20 MG per tablet, Take 1 tablet by mouth at bedtime.  , Disp: , Rfl: ;  metoprolol (TOPROL-XL) 100 MG 24 hr tablet, Take 50 mg by mouth daily.  , Disp: , Rfl:  Multiple Vitamin (MULTIVITAMIN) tablet, Take 1 tablet by mouth daily.  , Disp: , Rfl: ;  Omega-3 Fatty Acids (FISH OIL) 1200 MG CAPS, Take 1 capsule by mouth daily.  , Disp: , Rfl:   Past Medical History  Diagnosis Date  . Coronary atherosclerosis of native coronary artery     Occluded RCA with collaterals 2004  . Essential hypertension, benign   . Mixed hyperlipidemia   . Hypothyroidism     Social History Ricky Wilson reports that he has never smoked. He has never used smokeless tobacco. Ricky Wilson reports that he drinks alcohol.  Review of Systems  Negative for exertional chest pain, DOE, orthopnea, PND, lower extremity edema, palpitations, presyncope/syncope, claudication, reflux, hematuria, hematochezia, or melena. Remaining systems reviewed, and are negative.   Physical Examination There were no vitals filed for this visit. Normally nourished appearing male in no acute distress.  HEENT: Conjunctiva and lids normal, oropharynx clear.  Neck: Supple, no elevated jugular venous pressure, no bruits.  Lungs: Clear to auscultation,  nonlabored.  Cardiac: Regular rate and rhythm, no S3, no significant murmur.  Abdomen: Soft, nontender, no hepatomegaly, no bruits.  Skin: Warm and dry.  Extremities: No pitting edema, distal pulses 2+.  Neuropsychiatric: Alert and oriented x3, affect appropriate.   ECG Normal sinus rhythm at 65 bpm; Normal axis; no acute changes.   Studies Exercise Cardiolite 08/20/2010: No diagnostic ST segment changes, no chest pain. No ischemic perfusion defects, LVEF 58%.   Problem List and Plan

## 2010-10-06 NOTE — Patient Instructions (Signed)
Your physician wants you to follow-up in: 6 months. You will receive a reminder letter in the mail one-two months in advance. If you don't receive a letter, please call our office to schedule the follow-up appointment. Decrease Aspirin to 81 mg daily. Decrease Toprol (metoprolol) to 25 mg daily.

## 2010-10-06 NOTE — Assessment & Plan Note (Addendum)
Continue current medication regimen. Recent normal exercise stress Myoview. Patient has requested further decrease in Toprol, if feasible, and I recommended decreasing to 25 mg daily. He reported improved stamina and energy, following decrease from 100 to 50 mg daily.  Patient cleared to increase physical activity, as tolerated. Return clinic visit with Dr. Diona Browner in 6 months.

## 2010-10-06 NOTE — Assessment & Plan Note (Signed)
Well-controlled on current medication regimen 

## 2010-10-06 NOTE — Assessment & Plan Note (Signed)
Well controlled with LDL 68, by most recent lipid profile. Target LDL 70 or less, if feasible.

## 2011-03-12 ENCOUNTER — Other Ambulatory Visit: Payer: Self-pay | Admitting: Cardiology

## 2011-03-24 ENCOUNTER — Encounter (HOSPITAL_BASED_OUTPATIENT_CLINIC_OR_DEPARTMENT_OTHER): Payer: Self-pay | Admitting: *Deleted

## 2011-03-24 ENCOUNTER — Other Ambulatory Visit: Payer: Self-pay | Admitting: Orthopedic Surgery

## 2011-03-24 NOTE — Pre-Procedure Instructions (Signed)
To come for BMET, CXR, uric acid

## 2011-03-30 ENCOUNTER — Other Ambulatory Visit: Payer: Self-pay | Admitting: Orthopedic Surgery

## 2011-03-30 ENCOUNTER — Ambulatory Visit
Admission: RE | Admit: 2011-03-30 | Discharge: 2011-03-30 | Disposition: A | Discharge status: Home / Self Care | Payer: 59 | Source: Ambulatory Visit | Attending: Orthopedic Surgery | Admitting: Orthopedic Surgery

## 2011-03-30 ENCOUNTER — Encounter (HOSPITAL_BASED_OUTPATIENT_CLINIC_OR_DEPARTMENT_OTHER)
Admission: RE | Admit: 2011-03-30 | Discharge: 2011-03-30 | Disposition: A | Discharge status: Home / Self Care | Payer: 59 | Source: Ambulatory Visit

## 2011-03-30 ENCOUNTER — Encounter (HOSPITAL_BASED_OUTPATIENT_CLINIC_OR_DEPARTMENT_OTHER): Payer: Self-pay | Admitting: *Deleted

## 2011-03-30 ENCOUNTER — Ambulatory Visit (HOSPITAL_COMMUNITY): Admission: RE | Admit: 2011-03-30 | Payer: 59 | Source: Ambulatory Visit

## 2011-03-30 DIAGNOSIS — Z01811 Encounter for preprocedural respiratory examination: Secondary | ICD-10-CM

## 2011-03-30 LAB — BASIC METABOLIC PANEL
BUN: 11 mg/dL (ref 6–23)
Chloride: 103 mEq/L (ref 96–112)
GFR calc Af Amer: >90 mL/min (ref >90)
Potassium: 4.4 mEq/L (ref 3.5–5.1)
Sodium: 138 mEq/L (ref 135–145)

## 2011-03-30 LAB — URIC ACID: Uric Acid, Serum: 9.3 mg/dL — ABNORMAL HIGH (ref 4.0–7.8)

## 2011-03-31 ENCOUNTER — Ambulatory Visit (HOSPITAL_BASED_OUTPATIENT_CLINIC_OR_DEPARTMENT_OTHER): Payer: 59 | Admitting: Anesthesiology

## 2011-03-31 ENCOUNTER — Encounter (HOSPITAL_BASED_OUTPATIENT_CLINIC_OR_DEPARTMENT_OTHER): Payer: Self-pay | Admitting: Anesthesiology

## 2011-03-31 ENCOUNTER — Ambulatory Visit (HOSPITAL_BASED_OUTPATIENT_CLINIC_OR_DEPARTMENT_OTHER)
Admission: RE | Admit: 2011-03-31 | Discharge: 2011-03-31 | Disposition: A | Discharge status: Home / Self Care | Payer: 59 | Source: Ambulatory Visit | Attending: Orthopedic Surgery | Admitting: Orthopedic Surgery

## 2011-03-31 ENCOUNTER — Encounter (HOSPITAL_BASED_OUTPATIENT_CLINIC_OR_DEPARTMENT_OTHER): Payer: Self-pay | Admitting: *Deleted

## 2011-03-31 ENCOUNTER — Encounter (HOSPITAL_BASED_OUTPATIENT_CLINIC_OR_DEPARTMENT_OTHER)
Admission: RE | Disposition: A | Discharge status: Home / Self Care | Payer: Self-pay | Source: Ambulatory Visit | Attending: Orthopedic Surgery

## 2011-03-31 DIAGNOSIS — I10 Essential (primary) hypertension: Secondary | ICD-10-CM | POA: Insufficient documentation

## 2011-03-31 DIAGNOSIS — G5600 Carpal tunnel syndrome, unspecified upper limb: Secondary | ICD-10-CM | POA: Insufficient documentation

## 2011-03-31 DIAGNOSIS — Z01812 Encounter for preprocedural laboratory examination: Secondary | ICD-10-CM | POA: Insufficient documentation

## 2011-03-31 DIAGNOSIS — M109 Gout, unspecified: Secondary | ICD-10-CM | POA: Insufficient documentation

## 2011-03-31 HISTORY — DX: Carpal tunnel syndrome, bilateral upper limbs: G56.03

## 2011-03-31 HISTORY — DX: Unspecified osteoarthritis, unspecified site: M19.90

## 2011-03-31 HISTORY — DX: Essential (primary) hypertension: I10

## 2011-03-31 HISTORY — PX: CARPAL TUNNEL RELEASE: SHX101

## 2011-03-31 SURGERY — CARPAL TUNNEL RELEASE
Anesthesia: General | Site: Wrist | Laterality: Left | Wound class: Clean

## 2011-03-31 MED ORDER — DROPERIDOL 2.5 MG/ML IJ SOLN
INTRAMUSCULAR | Status: DC | PRN
  Administered 2011-03-31: 0.625 mg via INTRAVENOUS

## 2011-03-31 MED ORDER — FENTANYL CITRATE 0.05 MG/ML IJ SOLN: 50.0000 ug | INTRAMUSCULAR | Status: DC | PRN

## 2011-03-31 MED ORDER — ONDANSETRON HCL 4 MG/2ML IJ SOLN
INTRAMUSCULAR | Status: DC | PRN
  Administered 2011-03-31: 4 mg via INTRAVENOUS

## 2011-03-31 MED ORDER — DEXAMETHASONE SODIUM PHOSPHATE 4 MG/ML IJ SOLN
INTRAMUSCULAR | Status: DC | PRN
  Administered 2011-03-31: 10 mg via INTRAVENOUS

## 2011-03-31 MED ORDER — MIDAZOLAM HCL 5 MG/5ML IJ SOLN
INTRAMUSCULAR | Status: DC | PRN
  Administered 2011-03-31: 2 mg via INTRAVENOUS

## 2011-03-31 MED ORDER — HYDROCODONE-ACETAMINOPHEN 5-325 MG PO TABS: ORAL_TABLET | ORAL | Status: AC

## 2011-03-31 MED ORDER — METOCLOPRAMIDE HCL 5 MG/ML IJ SOLN: 10.0000 mg | Freq: Once | INTRAMUSCULAR | Status: DC | PRN

## 2011-03-31 MED ORDER — LIDOCAINE HCL (CARDIAC) 20 MG/ML IV SOLN
INTRAVENOUS | Status: DC | PRN
  Administered 2011-03-31: 50 mg via INTRAVENOUS

## 2011-03-31 MED ORDER — MORPHINE SULFATE 2 MG/ML IJ SOLN: 0.0500 mg/kg | INTRAMUSCULAR | Status: DC | PRN

## 2011-03-31 MED ORDER — FENTANYL CITRATE 0.05 MG/ML IJ SOLN
INTRAMUSCULAR | Status: DC | PRN
  Administered 2011-03-31: 50 ug via INTRAVENOUS

## 2011-03-31 MED ORDER — PROPOFOL 10 MG/ML IV EMUL
INTRAVENOUS | Status: DC | PRN
  Administered 2011-03-31: 200 mg via INTRAVENOUS

## 2011-03-31 MED ORDER — CHLORHEXIDINE GLUCONATE 4 % EX LIQD: 60.0000 mL | Freq: Once | CUTANEOUS | Status: DC

## 2011-03-31 MED ORDER — MIDAZOLAM HCL 2 MG/2ML IJ SOLN: 0.5000 mg | INTRAMUSCULAR | Status: DC | PRN

## 2011-03-31 MED ORDER — FENTANYL CITRATE 0.05 MG/ML IJ SOLN: 25.0000 ug | INTRAMUSCULAR | Status: DC | PRN

## 2011-03-31 MED ORDER — LACTATED RINGERS IV SOLN
INTRAVENOUS | Status: DC
  Administered 2011-03-31 (×2): via INTRAVENOUS

## 2011-03-31 MED ORDER — LIDOCAINE HCL 2 % IJ SOLN
INTRAMUSCULAR | Status: DC | PRN
  Administered 2011-03-31: 4 mL

## 2011-03-31 SURGICAL SUPPLY — 38 items
BANDAGE ADHESIVE 1X3 (GAUZE/BANDAGES/DRESSINGS) IMPLANT
BANDAGE ELASTIC 3 VELCRO ST LF (GAUZE/BANDAGES/DRESSINGS) ×2 IMPLANT
BLADE SURG 15 STRL LF DISP TIS (BLADE) ×1 IMPLANT
BLADE SURG 15 STRL SS (BLADE) ×2
BNDG CMPR 9X4 STRL LF SNTH (GAUZE/BANDAGES/DRESSINGS)
BNDG ESMARK 4X9 LF (GAUZE/BANDAGES/DRESSINGS) IMPLANT
BRUSH SCRUB EZ PLAIN DRY (MISCELLANEOUS) ×2 IMPLANT
CLOTH BEACON ORANGE TIMEOUT ST (SAFETY) ×2 IMPLANT
CORDS BIPOLAR (ELECTRODE) IMPLANT
COVER MAYO STAND STRL (DRAPES) ×2 IMPLANT
COVER TABLE BACK 60X90 (DRAPES) ×2 IMPLANT
CUFF TOURNIQUET SINGLE 18IN (TOURNIQUET CUFF) ×1 IMPLANT
DECANTER SPIKE VIAL GLASS SM (MISCELLANEOUS) IMPLANT
DRAPE EXTREMITY T 121X128X90 (DRAPE) ×2 IMPLANT
DRAPE SURG 17X23 STRL (DRAPES) ×2 IMPLANT
GLOVE BIO SURGEON STRL SZ 6.5 (GLOVE) ×1 IMPLANT
GLOVE BIOGEL M STRL SZ7.5 (GLOVE) ×2 IMPLANT
GLOVE ORTHO TXT STRL SZ7.5 (GLOVE) ×2 IMPLANT
GOWN PREVENTION PLUS XLARGE (GOWN DISPOSABLE) ×2 IMPLANT
GOWN PREVENTION PLUS XXLARGE (GOWN DISPOSABLE) ×2 IMPLANT
GOWN STRL REIN 2XL XLG LVL4 (GOWN DISPOSABLE) ×2 IMPLANT
NEEDLE 27GAX1X1/2 (NEEDLE) ×1 IMPLANT
PACK BASIN DAY SURGERY FS (CUSTOM PROCEDURE TRAY) ×2 IMPLANT
PAD CAST 3X4 CTTN HI CHSV (CAST SUPPLIES) ×1 IMPLANT
PADDING CAST ABS 4INX4YD NS (CAST SUPPLIES)
PADDING CAST ABS COTTON 4X4 ST (CAST SUPPLIES) ×1 IMPLANT
PADDING CAST COTTON 3X4 STRL (CAST SUPPLIES) ×2
SPLINT PLASTER CAST XFAST 3X15 (CAST SUPPLIES) ×5 IMPLANT
SPLINT PLASTER XTRA FASTSET 3X (CAST SUPPLIES) ×5
SPONGE GAUZE 4X4 12PLY (GAUZE/BANDAGES/DRESSINGS) ×1 IMPLANT
STOCKINETTE 4X48 STRL (DRAPES) ×2 IMPLANT
STRIP CLOSURE SKIN 1/2X4 (GAUZE/BANDAGES/DRESSINGS) ×2 IMPLANT
SUT PROLENE 3 0 PS 2 (SUTURE) ×2 IMPLANT
SYR 3ML 23GX1 SAFETY (SYRINGE) IMPLANT
SYR CONTROL 10ML LL (SYRINGE) ×1 IMPLANT
TRAY DSU PREP LF (CUSTOM PROCEDURE TRAY) ×2 IMPLANT
UNDERPAD 30X30 INCONTINENT (UNDERPADS AND DIAPERS) ×2 IMPLANT
WATER STERILE IRR 1000ML POUR (IV SOLUTION) ×2 IMPLANT

## 2011-03-31 NOTE — Anesthesia Postprocedure Evaluation (Signed)
Anesthesia Post Note  Patient: Ricky Wilson  Procedure(s) Performed:  CARPAL TUNNEL RELEASE  Anesthesia type: General  Patient location: PACU  Post pain: Pain level controlled  Post assessment: Patient's Cardiovascular Status Stable  Last Vitals:  Filed Vitals:   03/31/11 1030  BP: 103/65  Pulse: 60  Temp:   Resp: 12    Post vital signs: Reviewed and stable  Level of consciousness: alert  Complications: No apparent anesthesia complications

## 2011-03-31 NOTE — Op Note (Signed)
Op note dictated 03/31/11 [id number]

## 2011-03-31 NOTE — Op Note (Signed)
Ricky Wilson, Ricky Wilson                ACCOUNT NO.:  [id number]  MEDICAL RECORD NO.:  [id number]  LOCATION:  XRAY                         FACILITY:  [facility]  PHYSICIAN:  Katy Fitch. Adilynne Fitzwater, M.D. DATE OF BIRTH:  [birth year]  DATE OF PROCEDURE:  03/31/2011 DATE OF DISCHARGE:                              OPERATIVE REPORT   PREOPERATIVE DIAGNOSIS:  Chronic left carpal tunnel syndrome with background clinical gout.  POSTOPERATIVE DIAGNOSIS:  Chronic left carpal tunnel syndrome with background clinical gout.  OPERATION:  Release of left transverse carpal ligament.  OPERATING SURGEON:  Katy Fitch. Jadian Karman, MD.  ASSISTANT:  Marveen Reeks Dasnoit, PA.  ANESTHESIA:  General by LMA.  SUPERVISING ANESTHESIOLOGIST:  Janetta Hora. Gelene Mink, MD.  INDICATIONS:  Ricky Wilson is a 56 year old gentleman referred through the courtesy of Doreen Beam, MD of [place], [place].  He has a history of hypertension, clinical gout, history of coronary artery disease, and hyperlipidemia.  He was sent to [facility] for electrodiagnostic studies, which revealed very significant bilateral carpal tunnel syndrome.  We subsequently referred for Hand Surgery consult.  Clinical examination suggested a significant bilateral carpal tunnel syndrome. His history also suggested clinical gout.  He had not had a recent uric acid.  Due to a failure to respond to nonoperative measures, he is brought to the operating room at this time for release of his left transverse carpal ligament.  In assessing his gout, we sent him for preoperative uric acid.  His preoperative lab revealed a uric acid of 9.3 and a slightly reduced glomerular infiltration rate as calculated from his laboratory data.  With this information, we proceeded to schedule him for release of his left transverse carpal ligament.  I contacted Dr. Sherril Croon' office and reported to his nurse, Alona Bene that Ricky Wilson had elevated uric acid.  This  was pertinent in that he also was on a chronic full-dose aspirin regimen.  PROCEDURE IN DETAIL:  Ricky Wilson was brought to room 1 of the [facility] and placed in supine position on the operating table.  Following the induction of general anesthesia by LMA technique, the left arm was prepped with Betadine soap and solution, and sterilely draped. A pneumatic tourniquet was applied to the proximal left brachium.  Following exsanguination of the left arm with Esmarch bandage, arterial tourniquet was inflated to 220 mmHg.  Following routine surgical time-out, procedure commenced with a short incision in the line of the ring finger of the palm.  Subcutaneous tissues were carefully divided revealing the palmar fascia.  This was split longitudinally to reveal the common sensory branch of the median nerve.  These were followed back to the transverse carpal ligament, which was gently isolated from the median nerve with a Insurance risk surveyor.  After clearing a space between the transverse carpal ligament and the ulnar bursa, the transverse carpal ligament was released with scissors along its ulnar border into the distal forearm.  This widely opened the carpal canal.  The median nerve was quite edematous and bruised.  The tenosynovium was very thick and upon dissection was noted to have marked fibrosis and perhaps a few uric acid crystals noted.  It would  appear that gout may be part of the etiology of Ricky Wilson development of carpal tunnel syndrome.  The wound was inspected for bleeding points followed by repair of the skin with intradermal 3-0 Prolene.  A compressive dressing was applied with a volar plaster splint maintaining the wrist in 10 degrees of dorsiflexion.  For aftercare, Ricky Wilson is provided a prescription for hydrocodone 5/325 one p.o. q.4-6 hours p.r.n. pain, 20 tablets without refill.     Katy Fitch Ericah Scotto, M.D.     RVS/MEDQ  D:  03/31/2011   T:  03/31/2011  Job:  [id number]  cc:   Doreen Beam, MD

## 2011-03-31 NOTE — H&P (Addendum)
  March 02, 2011     Doreen Beam, MD [address] [place], [place] [address] FAX # [phone]  RE: Ricky Wilson  WJ#[id number]   Dear Dr. Sherril Croon:  Thank you for sending Ricky Wilson for consult regarding his hand numbness predicament.  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  He has had a several year history of episodic numbness in his hands bilaterally. He has nocturnal numbness 5 out of 7 nights a week.  He has no antecedent history of injury.  He had a remote history of a bulging disc in his cervical spine. Recently he has had episodes of acute gout. He does not know his uric acid level.  He has no history of diabetes or thyroid disease.    His past history is reviewed in detail. He has no known drug allergies.  Current medications are benazepril 10 mg. daily, metoprolol 50 mg. daily, Vytorin 10/20 daily and Indocin PRN acute gout episodes.  Prior surgery, vasectomy in 1977, elbow surgery in 2007, heart cath in 2008, knee surgery 2008, shoulder surgery 2008.  Social history reveals that he is married, he is a nonsmoker and does not drink alcoholic beverages.    Family history is detailed and positive for diabetes affecting his grandmother, coronary artery disease  affecting his father, hypertension affecting his father and arthritis affecting his grandmother.    14-point review of systems reveals corrective lenses, high blood pressure.   Physical examination reveals a well appearing 56 year-old gentleman.  Inspection of his hands reveals minimal stigmata of osteoarthritis.  He has all the positive provocative signs of carpal tunnel syndrome including positive wrist flexion test and direct compression.  He has no radicular signs or symptoms.  Electrodiagnostic studies were reviewed.  These were accomplished at [facility] on November 26th, 2012.  He is noted to have moderate bilateral carpal tunnel syndrome.    ASSESSMENT:   Bilateral carpal tunnel syndrome.    PLAN:   Screening x-rays of his hands were obtained.  They are remarkable for normal bony anatomy for his age.    He is scheduled for release of his transverse carpal ligaments in a staged manner.  His first surgery will be scheduled for March 30, 2010.  We will decide which side at the time of surgery and will address the most symptomatic side at that time.   He will likely schedule his opposite side surgery within 3 to 4 weeks.  The procedure and aftercare were described in detail.  We will keep you informed of his progress.   Best regards,    Ricky Wilson, M.D., Ricky Wilson.  RVS:cmf T:  12.7.12   Ricky Wilson,Ricky Wilson Ricky Wilson,Ricky Wilson

## 2011-03-31 NOTE — Anesthesia Procedure Notes (Signed)
Procedure Name: LMA Insertion Date/Time: 03/31/2011 9:03 AM Performed by: Jearld Shines Pre-anesthesia Checklist: Patient identified, Timeout performed, Emergency Drugs available, Suction available and Patient being monitored Patient Re-evaluated:Patient Re-evaluated prior to inductionOxygen Delivery Method: Circle System Utilized Preoxygenation: Pre-oxygenation with 100% oxygen Intubation Type: IV induction Ventilation: Mask ventilation without difficulty LMA: LMA inserted LMA Size: 4.0 Number of attempts: 1 Placement Confirmation: positive ETCO2 and breath sounds checked- equal and bilateral Tube secured with: Tape Dental Injury: Teeth and Oropharynx as per pre-operative assessment

## 2011-03-31 NOTE — Brief Op Note (Signed)
03/31/2011  9:24 AM  PATIENT:  Ricky Wilson  56 y.o. male  PRE-OPERATIVE DIAGNOSIS:  bilateral carpal tunnel syndrome with clinical gout  POST-OPERATIVE DIAGNOSIS:  Left carpal tunnel syndrome with gouty tenosynovitis  PROCEDURE:  Procedure(s): CARPAL TUNNEL RELEASE  SURGEON:  Surgeon(s): Wyn Forster., MD  PHYSICIAN ASSISTANT:   ASSISTANTS: Mallory Shirk.A-C    ANESTHESIA:   general  EBL:  Total I/O In: 500 [I.V.:500] Out: -   BLOOD ADMINISTERED:none  DRAINS: none   LOCAL MEDICATIONS USED:  XYLOCAINE 3 CC  SPECIMEN:  No Specimen  DISPOSITION OF SPECIMEN:  N/A  COUNTS:  YES  TOURNIQUET:   Total Tourniquet Time Documented: Upper Arm (Left) - 10 minutes  DICTATION: .Other Dictation: Dictation Number [id number]  PLAN OF CARE: Discharge to home after PACU  PATIENT DISPOSITION:  PACU - hemodynamically stable.

## 2011-03-31 NOTE — H&P (Signed)
Ricky Wilson is an 56 y.o. male.   Chief Complaint: c/o bilateral hand numbness and tingling HPI: Pt is a 56 y/o right handed male who presented to our office c/o a 1-2 year h/o bilateral hand numbness and tingling. No h/o trauma. He is awakened 5/7 nights.  Past Medical History  Diagnosis Date  . Coronary atherosclerosis of native coronary artery     Occluded RCA with collaterals 2004  . Mixed hyperlipidemia   . Arthritis     hands, knees  . Hypertension     under control; has been on med. x 4-5 yrs.  . History of cardiac cath 01/2003  . Carpal tunnel syndrome, bilateral 02/2011    Past Surgical History  Procedure Date  . Vasectomy 1977  . Torn left medial meniscus 2008  . Elbow surgery 2008    right    Family History  Problem Relation Age of Onset  . Coronary artery disease     Social History:  reports that he has never smoked. He has never used smokeless tobacco. He reports that he drinks alcohol. He reports that he does not use illicit drugs.  Allergies: No Known Allergies  No current facility-administered medications on file as of 03/31/2011.   Medications Prior to Admission  Medication Sig Dispense Refill  . aspirin 325 MG tablet Take 325 mg by mouth daily.        . benazepril (LOTENSIN) 10 MG tablet Take 10 mg by mouth daily. PM      . metoprolol (TOPROL-XL) 100 MG 24 hr tablet Take 50 mg by mouth daily. AM      . Multiple Vitamin (MULTIVITAMIN) tablet Take 1 tablet by mouth daily.        . Omega-3 Fatty Acids (FISH OIL) 1200 MG CAPS Take 1 capsule by mouth daily.          Results for orders placed during the hospital encounter of 03/31/11 (from the past 48 hour(s))  BASIC METABOLIC PANEL     Status: Abnormal   Collection Time   03/30/11 12:21 PM      Component Value Range Comment   Sodium 138  135 - 145 (mEq/L)    Potassium 4.4  3.5 - 5.1 (mEq/L)    Chloride 103  96 - 112 (mEq/L)    CO2 27  19 - 32 (mEq/L)    Glucose, Bld 103 (*) 70 - 99 (mg/dL)    BUN 11  6 - 23 (mg/dL)    Creatinine, Ser 1.61  0.50 - 1.35 (mg/dL)    Calcium 09.6 (*) 8.4 - 10.5 (mg/dL)    GFR calc non Af Amer 78 (*) >90 (mL/min)    GFR calc Af Amer >90  >90 (mL/min)   URIC ACID     Status: Abnormal   Collection Time   03/30/11 12:21 PM      Component Value Range Comment   Uric Acid, Serum 9.3 (*) 4.0 - 7.8 (mg/dL)     Dg Chest 2 View  0/06/5407  *RADIOLOGY REPORT*  Clinical Data: Preop for left hand surgery.  CHEST - 2 VIEW  Comparison: Chest x-ray 03/08/2010.  Findings: The cardiac silhouette, mediastinal and hilar contours are within normal limits and stable.  Mild chronic bronchitic type lung changes but no infiltrates, edema or effusions.  The bony thorax is intact.  IMPRESSION: Chronic bronchitic type lung changes but no acute pulmonary findings.  Original Report Authenticated By: P. Loralie Champagne, M.D.     Pertinent items  are noted in HPI.  Height 5\' 10"  (1.778 m), weight 95.255 kg (210 lb).  General appearance: alert Head: Normocephalic, without obvious abnormality Neck: supple, symmetrical, trachea midline Resp: clear to auscultation bilaterally Cardio: regular rate and rhythm, S1, S2 normal, no murmur, click, rub or gallop GI: normal findings: bowel sounds normal Extremities:exam of hands revealed positive Tinel's and Phalen's. FROM of wrist and digits. NCV revealed bilateral CTS. Pulses: 2+ and symmetric Skin: normal Neurologic: Grossly normal :   Assessment/Plan Bilateral CTS, clinical gout with elevated uric acid  Plan: To OR for Left CTR. The risks, benefits and post-op course were discussed with the patient and he was in agreement. Consider lowering aspirin dosage.  Ricky Wilson J 03/31/2011, 7:14 AM  H&P documentation: 03/31/2011  -History and Physical Reviewed  -Patient has been re-examined  -No change in the plan of care  Ricky Forster, MD

## 2011-03-31 NOTE — Anesthesia Preprocedure Evaluation (Signed)
Anesthesia Evaluation  Patient identified by MRN, date of birth, ID band Patient awake    Reviewed: Allergy & Precautions, H&P , NPO status , Patient's Chart, lab work & pertinent test results, reviewed documented beta blocker date and time   Airway Mallampati: II TM Distance: >3 FB Neck ROM: full    Dental   Pulmonary neg pulmonary ROS,          Cardiovascular hypertension, On Home Beta Blockers and On Medications + CAD     Neuro/Psych  Neuromuscular disease Negative Psych ROS   GI/Hepatic negative GI ROS, Neg liver ROS,   Endo/Other  Negative Endocrine ROS  Renal/GU negative Renal ROS  Genitourinary negative   Musculoskeletal   Abdominal   Peds  Hematology negative hematology ROS (+)   Anesthesia Other Findings See surgeon's H&P   Reproductive/Obstetrics negative OB ROS                           Anesthesia Physical Anesthesia Plan  ASA: III  Anesthesia Plan: General   Post-op Pain Management:    Induction: Intravenous  Airway Management Planned: LMA  Additional Equipment:   Intra-op Plan:   Post-operative Plan: Extubation in OR  Informed Consent: I have reviewed the patients History and Physical, chart, labs and discussed the procedure including the risks, benefits and alternatives for the proposed anesthesia with the patient or authorized representative who has indicated his/her understanding and acceptance.     Plan Discussed with: CRNA and Surgeon  Anesthesia Plan Comments:         Anesthesia Quick Evaluation

## 2011-03-31 NOTE — Transfer of Care (Signed)
Immediate Anesthesia Transfer of Care Note  Patient: [facility]  Procedure(s) Performed:  CARPAL TUNNEL RELEASE  Patient Location: PACU  Anesthesia Type: General  Level of Consciousness: sedated  Airway & Oxygen Therapy: Patient Spontanous Breathing and Patient connected to face mask oxygen  Post-op Assessment: Report given to PACU RN and Post -op Vital signs reviewed and stable  Post vital signs: Reviewed and stable  Complications: No apparent anesthesia complications

## 2011-04-04 ENCOUNTER — Encounter (HOSPITAL_BASED_OUTPATIENT_CLINIC_OR_DEPARTMENT_OTHER): Payer: Self-pay | Admitting: Orthopedic Surgery

## 2011-04-22 ENCOUNTER — Ambulatory Visit: Payer: 59 | Admitting: Cardiology

## 2011-05-09 ENCOUNTER — Ambulatory Visit (INDEPENDENT_AMBULATORY_CARE_PROVIDER_SITE_OTHER): Payer: 59 | Admitting: Cardiology

## 2011-05-09 ENCOUNTER — Encounter: Payer: Self-pay | Admitting: Cardiology

## 2011-05-09 VITALS — BP 150/82 | HR 71 | Ht 70.0 in | Wt 218.0 lb

## 2011-05-09 DIAGNOSIS — I10 Essential (primary) hypertension: Secondary | ICD-10-CM

## 2011-05-09 DIAGNOSIS — E785 Hyperlipidemia, unspecified: Secondary | ICD-10-CM

## 2011-05-09 DIAGNOSIS — I2510 Atherosclerotic heart disease of native coronary artery without angina pectoris: Secondary | ICD-10-CM

## 2011-05-09 MED ORDER — SIMVASTATIN 20 MG PO TABS: 20.0000 mg | ORAL_TABLET | Freq: Every evening | ORAL | Status: DC

## 2011-05-09 NOTE — Assessment & Plan Note (Signed)
Plan to switch her Vytorin to simvastatin 20 mg daily. Keep regular lipid followup labs with Dr. Sherril Croon.

## 2011-05-09 NOTE — Patient Instructions (Signed)
Your physician wants you to follow-up in: 6 months. You will receive a reminder letter in the mail one-two months in advance. If you don't receive a letter, please call our office to schedule the follow-up appointment. Your physician recommends that you continue on your current medications as directed. Please refer to the Current Medication list given to you today. When you finish your current supply of Vytorin, stop and start Simvastatin 20 mg every night. A prescription was sent to your pharmacy.

## 2011-05-09 NOTE — Progress Notes (Signed)
   Clinical Summary Ricky Wilson is a 56 y.o.male presenting for followup. He was seen in July 2012.  Record review finds carpal tunnel release on the left performed back in January.  Did well with this, and is to undergo right-sided surgery in the next few weeks.  He reports no angina or unusual breathlessness. Exercise has been limited in light of his recent surgery.  We discussed his medications, planned a switch from Vytorin to generic simvastatin. His lipids were very well controlled at last check.   No Known Allergies  Current Outpatient Prescriptions  Medication Sig Dispense Refill  . aspirin EC 81 MG tablet Take 81 mg by mouth daily.      . benazepril (LOTENSIN) 10 MG tablet Take 10 mg by mouth daily. PM      . metoprolol (TOPROL-XL) 100 MG 24 hr tablet Take 50 mg by mouth daily. AM      . Multiple Vitamin (MULTIVITAMIN) tablet Take 1 tablet by mouth daily.        . Omega-3 Fatty Acids (FISH OIL) 1200 MG CAPS Take 1 capsule by mouth daily.        . simvastatin (ZOCOR) 20 MG tablet Take 1 tablet (20 mg total) by mouth every evening.  30 tablet  6    Past Medical History  Diagnosis Date  . Coronary atherosclerosis of native coronary artery     Occluded RCA with collaterals 2004  . Mixed hyperlipidemia   . Arthritis   . Hypertension   . Carpal tunnel syndrome, bilateral     Past Surgical History  Procedure Date  . Vasectomy 1977  . Torn left medial meniscus 2008  . Elbow surgery 2008    right  . Carpal tunnel release 03/31/2011    Procedure: CARPAL TUNNEL RELEASE;  Surgeon: Ricky Forster., MD;  Location: [facility];  Service: Orthopedics;  Laterality: Left;    Family History  Problem Relation Age of Onset  . Coronary artery disease      Social History Ricky Wilson reports that he has never smoked. He has never used smokeless tobacco. Ricky Wilson reports that he drinks alcohol.  Review of Systems No palpitations, orthopnea, PND. No reported  bleeding problems. Still some postsurgical discomfort. Otherwise negative.  Physical Examination Filed Vitals:   05/09/11 1433  BP: 150/82  Pulse: 71    Normally nourished appearing male in no acute distress.  HEENT: Conjunctiva and lids normal, oropharynx clear.  Neck: Supple, no elevated jugular venous pressure, no bruits.  Lungs: Clear to auscultation, nonlabored.  Cardiac: Regular rate and rhythm, no S3, no significant murmur.  Abdomen: Soft, nontender, no hepatomegaly, no bruits.  Skin: Warm and dry.  Extremities: No pitting edema, distal pulses 2+.  Neuropsychiatric: Alert and oriented x3, affect appropriate.   ECG Normal sinus rhythm.  Studies Exercise Cardiolite 08/20/2010:  No diagnostic ST segment changes, no chest pain. No ischemic perfusion defects, LVEF 58%.   Problem List and Plan

## 2011-05-09 NOTE — Assessment & Plan Note (Signed)
Blood pressure up some today. He has not been exercising as regularly following surgery. Continue present regimen.

## 2011-05-09 NOTE — Assessment & Plan Note (Signed)
Doing well, symptomatically stable on medical therapy. ECG is normal. Continue observation.

## 2011-05-11 ENCOUNTER — Other Ambulatory Visit: Payer: Self-pay | Admitting: Orthopedic Surgery

## 2011-05-16 ENCOUNTER — Encounter (HOSPITAL_BASED_OUTPATIENT_CLINIC_OR_DEPARTMENT_OTHER): Payer: Self-pay | Admitting: *Deleted

## 2011-05-16 NOTE — Progress Notes (Signed)
Pt here 1/13 for [facility]-did well-was done general-wants lighter anesth

## 2011-05-16 NOTE — H&P (Signed)
Ricky Wilson is an 56 y.o. male.   Chief Complaint: Complaining of chronic and progressive right hand numbness and tingling HPI: Patient is a 56 year old right-hand-dominant male who presented to our office recently for evaluation and treatment of chronic and progressive numbness and tingling to the right hand. He states that he is has nocturnal symptoms 5/7 nights. He has tried conservative treatment with splints and has been unsuccessful. Nerve conduction studies in our office revealed delayed onset latencies in both the motor and sensory portion of the study consistent with right carpal tunnel syndrome.  Past Medical History  Diagnosis Date  . Mixed hyperlipidemia   . Arthritis   . Hypertension   . Carpal tunnel syndrome, bilateral   . Coronary atherosclerosis of native coronary artery     Occluded RCA with collaterals 2004    Past Surgical History  Procedure Date  . Vasectomy 1977  . Torn left medial meniscus 2008  . Elbow surgery 2008    right  . Carpal tunnel release 03/31/2011    Procedure: CARPAL TUNNEL RELEASE;  Surgeon: Wyn Forster., MD;  Location: [facility];  Service: Orthopedics;  Laterality: Left;    Family History  Problem Relation Age of Onset  . Coronary artery disease     Social History:  reports that he has never smoked. He has never used smokeless tobacco. He reports that he drinks alcohol. He reports that he does not use illicit drugs.  Allergies: No Known Allergies  No current facility-administered medications on file as of .   Medications Prior to Admission  Medication Sig Dispense Refill  . aspirin EC 81 MG tablet Take 81 mg by mouth daily.      . benazepril (LOTENSIN) 10 MG tablet Take 10 mg by mouth daily. PM      . metoprolol (TOPROL-XL) 100 MG 24 hr tablet Take 50 mg by mouth daily. AM      . Multiple Vitamin (MULTIVITAMIN) tablet Take 1 tablet by mouth daily.        . Omega-3 Fatty Acids (FISH OIL) 1200 MG CAPS Take 1  capsule by mouth daily.        . simvastatin (ZOCOR) 20 MG tablet Take 1 tablet (20 mg total) by mouth every evening.  30 tablet  6    No results found for this or any previous visit (from the past 48 hour(s)).  No results found.   Pertinent items are noted in HPI.  There were no vitals taken for this visit.  General appearance: alert Head: Normocephalic, without obvious abnormality Neck: supple, symmetrical, trachea midline Resp: clear to auscultation bilaterally Cardio: regular rate and rhythm, S1, S2 normal, no murmur, click, rub or gallop GI: normal findings: bowel sounds normal Extremities: Examination of the right hand reveals a positive Phalen's and positive Tinel's normal sweat pattern. He has full range of motion of his fingers and wrist. Review of his nerve conduction studies reveals a delayed onset latencies in both the motor and sensory portion of the study consistent with a right carpal tunnel syndrome. Pulses: 2+ and symmetric Skin: normal Neurologic: Grossly normal    Assessment/Plan Impression: Right carpal tunnel syndrome  Plan: Patient to be taken to the operating room to undergo right carpal tunnel release. The procedure risks benefits and postoperative course were discussed at length with the patient he was in agreement with this plan.  DASNOIT,Ricky Wilson 05/16/2011, 9:44 PM   H&P documentation: 05/17/2011  -History and Physical Reviewed  -  Patient has been re-examined  -No change in the plan of care  Cammie Sickle, MD

## 2011-05-17 ENCOUNTER — Encounter (HOSPITAL_BASED_OUTPATIENT_CLINIC_OR_DEPARTMENT_OTHER): Payer: Self-pay | Admitting: Anesthesiology

## 2011-05-17 ENCOUNTER — Ambulatory Visit (HOSPITAL_BASED_OUTPATIENT_CLINIC_OR_DEPARTMENT_OTHER): Payer: 59 | Admitting: Anesthesiology

## 2011-05-17 ENCOUNTER — Ambulatory Visit (HOSPITAL_BASED_OUTPATIENT_CLINIC_OR_DEPARTMENT_OTHER)
Admission: RE | Admit: 2011-05-17 | Discharge: 2011-05-17 | Disposition: A | Discharge status: Home / Self Care | Payer: 59 | Source: Ambulatory Visit | Attending: Orthopedic Surgery | Admitting: Orthopedic Surgery

## 2011-05-17 ENCOUNTER — Encounter (HOSPITAL_BASED_OUTPATIENT_CLINIC_OR_DEPARTMENT_OTHER): Payer: Self-pay

## 2011-05-17 ENCOUNTER — Encounter (HOSPITAL_BASED_OUTPATIENT_CLINIC_OR_DEPARTMENT_OTHER)
Admission: RE | Disposition: A | Discharge status: Home / Self Care | Payer: Self-pay | Source: Ambulatory Visit | Attending: Orthopedic Surgery

## 2011-05-17 DIAGNOSIS — G5600 Carpal tunnel syndrome, unspecified upper limb: Secondary | ICD-10-CM | POA: Insufficient documentation

## 2011-05-17 DIAGNOSIS — I10 Essential (primary) hypertension: Secondary | ICD-10-CM | POA: Insufficient documentation

## 2011-05-17 DIAGNOSIS — E785 Hyperlipidemia, unspecified: Secondary | ICD-10-CM | POA: Insufficient documentation

## 2011-05-17 DIAGNOSIS — I2510 Atherosclerotic heart disease of native coronary artery without angina pectoris: Secondary | ICD-10-CM | POA: Insufficient documentation

## 2011-05-17 HISTORY — PX: CARPAL TUNNEL RELEASE: SHX101

## 2011-05-17 SURGERY — CARPAL TUNNEL RELEASE
Anesthesia: General | Laterality: Right

## 2011-05-17 MED ORDER — DEXAMETHASONE SODIUM PHOSPHATE 10 MG/ML IJ SOLN
INTRAMUSCULAR | Status: DC | PRN
  Administered 2011-05-17: 10 mg via INTRAVENOUS

## 2011-05-17 MED ORDER — FENTANYL CITRATE 0.05 MG/ML IJ SOLN
INTRAMUSCULAR | Status: DC | PRN
  Administered 2011-05-17 (×2): 50 ug via INTRAVENOUS

## 2011-05-17 MED ORDER — LACTATED RINGERS IV SOLN
INTRAVENOUS | Status: DC
  Administered 2011-05-17 (×2): via INTRAVENOUS

## 2011-05-17 MED ORDER — LIDOCAINE HCL 2 % IJ SOLN
INTRAMUSCULAR | Status: DC | PRN
  Administered 2011-05-17: 4 mL

## 2011-05-17 MED ORDER — ONDANSETRON HCL 4 MG/2ML IJ SOLN
INTRAMUSCULAR | Status: DC | PRN
  Administered 2011-05-17: 4 mg via INTRAVENOUS

## 2011-05-17 MED ORDER — DROPERIDOL 2.5 MG/ML IJ SOLN: 0.6250 mg | INTRAMUSCULAR | Status: DC | PRN

## 2011-05-17 MED ORDER — MIDAZOLAM HCL 5 MG/5ML IJ SOLN
INTRAMUSCULAR | Status: DC | PRN
  Administered 2011-05-17: 1 mg via INTRAVENOUS

## 2011-05-17 MED ORDER — HYDROMORPHONE HCL PF 1 MG/ML IJ SOLN: 0.2500 mg | INTRAMUSCULAR | Status: DC | PRN

## 2011-05-17 MED ORDER — LIDOCAINE HCL (CARDIAC) 20 MG/ML IV SOLN
INTRAVENOUS | Status: DC | PRN
  Administered 2011-05-17: 50 mg via INTRAVENOUS

## 2011-05-17 MED ORDER — PROPOFOL 10 MG/ML IV EMUL
INTRAVENOUS | Status: DC | PRN
  Administered 2011-05-17: 200 mg via INTRAVENOUS

## 2011-05-17 SURGICAL SUPPLY — 36 items
BANDAGE ADHESIVE 1X3 (GAUZE/BANDAGES/DRESSINGS) IMPLANT
BANDAGE ELASTIC 3 VELCRO ST LF (GAUZE/BANDAGES/DRESSINGS) ×2 IMPLANT
BLADE SURG 15 STRL LF DISP TIS (BLADE) ×1 IMPLANT
BLADE SURG 15 STRL SS (BLADE) ×2
BNDG CMPR 9X4 STRL LF SNTH (GAUZE/BANDAGES/DRESSINGS) ×1
BNDG ESMARK 4X9 LF (GAUZE/BANDAGES/DRESSINGS) ×1 IMPLANT
BRUSH SCRUB EZ PLAIN DRY (MISCELLANEOUS) ×2 IMPLANT
CLOTH BEACON ORANGE TIMEOUT ST (SAFETY) ×2 IMPLANT
CORDS BIPOLAR (ELECTRODE) ×1 IMPLANT
COVER MAYO STAND STRL (DRAPES) ×2 IMPLANT
COVER TABLE BACK 60X90 (DRAPES) ×2 IMPLANT
CUFF TOURNIQUET SINGLE 18IN (TOURNIQUET CUFF) ×1 IMPLANT
DECANTER SPIKE VIAL GLASS SM (MISCELLANEOUS) ×1 IMPLANT
DRAPE EXTREMITY T 121X128X90 (DRAPE) ×2 IMPLANT
DRAPE SURG 17X23 STRL (DRAPES) ×2 IMPLANT
GLOVE BIOGEL M STRL SZ7.5 (GLOVE) ×2 IMPLANT
GLOVE ORTHO TXT STRL SZ7.5 (GLOVE) ×2 IMPLANT
GOWN PREVENTION PLUS XLARGE (GOWN DISPOSABLE) ×2 IMPLANT
GOWN PREVENTION PLUS XXLARGE (GOWN DISPOSABLE) ×4 IMPLANT
NEEDLE 27GAX1X1/2 (NEEDLE) ×1 IMPLANT
PACK BASIN DAY SURGERY FS (CUSTOM PROCEDURE TRAY) ×2 IMPLANT
PAD CAST 3X4 CTTN HI CHSV (CAST SUPPLIES) ×1 IMPLANT
PADDING CAST ABS 4INX4YD NS (CAST SUPPLIES)
PADDING CAST ABS COTTON 4X4 ST (CAST SUPPLIES) ×1 IMPLANT
PADDING CAST COTTON 3X4 STRL (CAST SUPPLIES) ×2
SPLINT PLASTER CAST XFAST 3X15 (CAST SUPPLIES) ×5 IMPLANT
SPLINT PLASTER XTRA FASTSET 3X (CAST SUPPLIES) ×5
SPONGE GAUZE 4X4 12PLY (GAUZE/BANDAGES/DRESSINGS) ×2 IMPLANT
STOCKINETTE 4X48 STRL (DRAPES) ×2 IMPLANT
STRIP CLOSURE SKIN 1/2X4 (GAUZE/BANDAGES/DRESSINGS) ×2 IMPLANT
SUT PROLENE 3 0 PS 2 (SUTURE) ×2 IMPLANT
SYR 3ML 23GX1 SAFETY (SYRINGE) IMPLANT
SYR CONTROL 10ML LL (SYRINGE) ×1 IMPLANT
TRAY DSU PREP LF (CUSTOM PROCEDURE TRAY) ×2 IMPLANT
UNDERPAD 30X30 INCONTINENT (UNDERPADS AND DIAPERS) ×2 IMPLANT
WATER STERILE IRR 1000ML POUR (IV SOLUTION) ×1 IMPLANT

## 2011-05-17 NOTE — Anesthesia Preprocedure Evaluation (Addendum)
Anesthesia Evaluation  Patient identified by MRN, date of birth, ID band Patient awake    Reviewed: Allergy & Precautions, H&P , Patient's Chart, lab work & pertinent test results  Airway Mallampati: II TM Distance: >3 FB Neck ROM: full    Dental   Pulmonary neg pulmonary ROS,    Pulmonary exam normal       Cardiovascular hypertension, On Medications - CAD Atrial Fibrillation     Neuro/Psych  Neuromuscular disease    GI/Hepatic negative GI ROS, Neg liver ROS,   Endo/Other  Negative Endocrine ROS  Renal/GU negative Renal ROS     Musculoskeletal   Abdominal Normal abdominal exam  (+)   Peds  Hematology   Anesthesia Other Findings   Reproductive/Obstetrics                         Anesthesia Physical Anesthesia Plan  ASA: II  Anesthesia Plan:    Post-op Pain Management:    Induction:   Airway Management Planned:   Additional Equipment:   Intra-op Plan:   Post-operative Plan:   Informed Consent: I have reviewed the patients History and Physical, chart, labs and discussed the procedure including the risks, benefits and alternatives for the proposed anesthesia with the patient or authorized representative who has indicated his/her understanding and acceptance.   Dental Advisory Given  Plan Discussed with: Anesthesiologist, Surgeon and CRNA  Anesthesia Plan Comments:        Anesthesia Quick Evaluation

## 2011-05-17 NOTE — Op Note (Addendum)
Op note dictated 05/17/11 161096

## 2011-05-17 NOTE — Anesthesia Procedure Notes (Signed)
Procedure Name: LMA Insertion Performed by: Marquisa Salih Pre-anesthesia Checklist: Patient identified, Timeout performed, Emergency Drugs available, Suction available and Patient being monitored Patient Re-evaluated:Patient Re-evaluated prior to inductionOxygen Delivery Method: Circle System Utilized Preoxygenation: Pre-oxygenation with 100% oxygen Intubation Type: IV induction Ventilation: Mask ventilation without difficulty LMA: LMA with gastric port inserted LMA Size: 4.0 Number of attempts: 1 Placement Confirmation: breath sounds checked- equal and bilateral and positive ETCO2 Tube secured with: Tape Dental Injury: Teeth and Oropharynx as per pre-operative assessment      

## 2011-05-17 NOTE — Anesthesia Postprocedure Evaluation (Signed)
Anesthesia Post Note  Patient: Ricky Wilson  Procedure(s) Performed: Procedure(s) (LRB): CARPAL TUNNEL RELEASE (Right)  Anesthesia type: general  Patient location: PACU  Post pain: Pain level controlled  Post assessment: Patient's Cardiovascular Status Stable  Last Vitals:  Filed Vitals:   05/17/11 1245  BP: 113/73  Pulse: 64  Temp:   Resp: 12    Post vital signs: Reviewed and stable  Level of consciousness: sedated  Complications: No apparent anesthesia complications

## 2011-05-17 NOTE — Brief Op Note (Signed)
05/17/2011  12:25 PM  PATIENT:  Rogene Houston  56 y.o. male  PRE-OPERATIVE DIAGNOSIS:  Right Carpal Tunnel SYNDROME  POST-OPERATIVE DIAGNOSIS:  Right Carpal Tunnel SYNDROME  PROCEDURE:  CARPAL TUNNEL RELEASE (Right)  SURGEON:     * Wyn Forster., MD   PHYSICIAN ASSISTANT:   ASSISTANTS: Mallory Shirk.A-C   ANESTHESIA:   general  EBL:  Total I/O In: 700 [I.V.:700] Out: -   BLOOD ADMINISTERED:none  DRAINS: none   LOCAL MEDICATIONS USED:  LIDOCAINE   SPECIMEN:  No Specimen  DISPOSITION OF SPECIMEN:  N/A  COUNTS:  YES  TOURNIQUET:   Total Tourniquet Time Documented: Upper Arm (Right) - 12 minutes  DICTATION: .Other Dictation: Dictation Number [id number]  PLAN OF CARE: Discharge to home after PACU  PATIENT DISPOSITION:  PACU - hemodynamically stable.   Delay start of Pharmacological VTE agent (>24hrs) due to surgical blood loss or risk of bleeding: not applicable

## 2011-05-17 NOTE — Discharge Instructions (Signed)
[facility] Instructions Hand Surgery  Wound Care: Keep your hand elevated above the level of your heart.  Do not allow it to dangle  by your side.  Keep the dressing dry and do not remove it unless your doctor advises you to do so.  He will usually change it at the time of your post-op visit.  Moving your fingers is advised to stimulate circulation but will depend on the site of your surgery.  If you have a splint applied, your doctor will advise you regarding movement.  Activity: Do not drive or operate machinery today.  Rest today and then you may return to your normal activity and work as indicated by your physician.  Diet:  Drink liquids today or eat a light diet.  You may resume a regular diet tomorrow.    General expectations: Pain for two to three days. Fingers may become slightly swollen.  Call your doctor if any of the following occur: Severe pain not relieved by pain medication. Elevated temperature. Dressing soaked with blood. Inability to move fingers. White or bluish color to fingers.[facility]  [address] [place], [place] [address] [phone]   Post Anesthesia Home Care Instructions  Activity: Get plenty of rest for the remainder of the day. A responsible adult should stay with you for 24 hours following the procedure.  For the next 24 hours, DO NOT: -Drive a car -Operate machinery -Drink alcoholic beverages -Take any medication unless instructed by your physician -Make any legal decisions or sign important papers.  Meals: Start with liquid foods such as gelatin or soup. Progress to regular foods as tolerated. Avoid greasy, spicy, heavy foods. If nausea and/or vomiting occur, drink only clear liquids until the nausea and/or vomiting subsides. Call your physician if vomiting continues.  Special Instructions/Symptoms: Your throat may feel dry or sore from the anesthesia or the breathing tube placed in your throat during surgery. If  this causes discomfort, gargle with warm salt water. The discomfort should disappear within 24 hours.   

## 2011-05-17 NOTE — Transfer of Care (Signed)
Immediate Anesthesia Transfer of Care Note  Patient: [facility]  Procedure(s) Performed: Procedure(s) (LRB): CARPAL TUNNEL RELEASE (Right)  Patient Location: PACU  Anesthesia Type: General  Level of Consciousness: awake, alert  and oriented  Airway & Oxygen Therapy: Patient Spontanous Breathing and Patient connected to face mask oxygen  Post-op Assessment: Report given to PACU RN and Post -op Vital signs reviewed and stable  Post vital signs: Reviewed and stable  Complications: No apparent anesthesia complications

## 2011-05-18 ENCOUNTER — Encounter (HOSPITAL_BASED_OUTPATIENT_CLINIC_OR_DEPARTMENT_OTHER): Payer: Self-pay | Admitting: Orthopedic Surgery

## 2011-05-18 LAB — POCT I-STAT, CHEM 8
Chloride: 108 mEq/L (ref 96–112)
Glucose, Bld: 107 mg/dL — ABNORMAL HIGH (ref 70–99)
HCT: 40 % (ref 39.0–52.0)
Potassium: 4.3 mEq/L (ref 3.5–5.1)

## 2011-05-18 NOTE — Op Note (Signed)
NAMEBRANDI, Ricky Wilson [facility]             ACCOUNT NO.:  [id number]  MEDICAL RECORD NO.:  [id number]  LOCATION:                                 FACILITY:  PHYSICIAN:  Katy Fitch. Genavie Boettger, M.D. DATE OF BIRTH:  Aug 09, 1955  DATE OF PROCEDURE:  05/17/2011 DATE OF DISCHARGE:                              OPERATIVE REPORT   PREOPERATIVE DIAGNOSIS:  Entrapment neuropathy median nerve, right carpal tunnel.  POSTOPERATIVE DIAGNOSIS:  Entrapment neuropathy median nerve, right carpal tunnel.  OPERATION:  Release of right transverse carpal ligament.  SURGEON:  Katy Fitch. Harvey Matlack, MD  ASSISTANT:  Marveen Reeks Dasnoit, PA  ANESTHESIA:  General by LMA.  SUPERVISING ANESTHESIOLOGIST:  Quita Skye. Krista Blue, MD  INDICATIONS:  Ricky Wilson is a 56 year old gentleman referred through the courtesy of Dr. Doreen Beam of [place], [place] for management of bilateral hand numbness.  Clinical examination suggested carpal tunnel syndrome and electrodiagnostic studies confirmed bilateral carpal tunnel syndrome.  [name] is status post release of his left transverse carpal ligament and now returns for release of the right transverse carpal ligament. Preoperatively, he was reminded of the potential risks and benefits of surgery.  He has pain medication remaining from his prior procedure and advised [place] that he did not require another prescription.  Questions regarding anticipated procedure were invited and answered in detail.  PROCEDURE:  [name] was brought to room 6 of the [facility] and placed in supine position on the operating table. Following the induction of general anesthesia by LMA technique, the right arm was prepped with Betadine soap and solution and sterilely draped.  A pneumatic tourniquet was applied to the proximal right brachium.  Following exsanguination of the right arm with an Esmarch bandage, the arterial tourniquet was inflated to 250 mmHg due to mild systolic  hypertension.  Procedure commenced with a routine surgical time-out.  Thereafter, a 2 cm incision was fashioned in line of the ring finger and the palm. Subcutaneous tissues were carefully divided revealing the palmar fascia. This was split longitudinally to reveal the common sensory branch of the median nerve.  These were followed back to the transverse carpal ligament, which was gently isolated from the median nerve and ulnar bursa with a Insurance risk surveyor.  The ligament was then released along its ulnar border extending into the distal forearm widely opening the carpal canal.  There was a very fibrotic bursa noted.  Several fibrous bands tethering the nerve distally were released.  No masses or other predicaments were noted.  The wound was inspected for bleeding points which were electrocauterized with bipolar forceps, followed by repair of the wound with intradermal 3-0 Prolene suture.  A compressive dressing was applied with a volar plaster splint maintaining the wrist in 10 degrees of dorsiflexion.  For aftercare, [name] has a prescription for Percocet.  We will see him back in the office for followup in 1 week or sooner p.r.n. problems.     Katy Fitch [name], M.D.     RVS/MEDQ  D:  05/17/2011  T:  05/17/2011  Job:  [id number]

## 2011-06-13 ENCOUNTER — Other Ambulatory Visit: Payer: Self-pay | Admitting: *Deleted

## 2011-06-13 MED ORDER — BENAZEPRIL HCL 10 MG PO TABS: 10.0000 mg | ORAL_TABLET | Freq: Every day | ORAL | Status: DC

## 2011-11-11 ENCOUNTER — Ambulatory Visit (INDEPENDENT_AMBULATORY_CARE_PROVIDER_SITE_OTHER): Payer: 59 | Admitting: Cardiology

## 2011-11-11 ENCOUNTER — Encounter: Payer: Self-pay | Admitting: Cardiology

## 2011-11-11 VITALS — BP 130/79 | HR 66 | Ht 71.0 in | Wt 217.1 lb

## 2011-11-11 DIAGNOSIS — I2510 Atherosclerotic heart disease of native coronary artery without angina pectoris: Secondary | ICD-10-CM

## 2011-11-11 MED ORDER — METOPROLOL SUCCINATE ER 100 MG PO TB24: 100.0000 mg | ORAL_TABLET | Freq: Every day | ORAL | Status: DC

## 2011-11-11 NOTE — Assessment & Plan Note (Signed)
Stable on medical therapy. ECG reviewed. Continue observation. Followup arranged.

## 2011-11-11 NOTE — Progress Notes (Signed)
   Clinical Summary Mr. Ricky Wilson is a 56 y.o.male presenting for followup. He was seen in February. He has been doing well from a cardiac perspective, no active angina or progressive shortness of breath. Still exercising, although he was limited by increased gout flares. He has been doing better under the care of a rheumatologist.  Exercise Cardiolite from May 2012 showed no diagnostic ST segment changes, no ischemic perfusion defects, LVEF 58%.  ECG today reviewed showing sinus rhythm, decreased R wave progression, inferior Q wave.   No Known Allergies  Current Outpatient Prescriptions  Medication Sig Dispense Refill  . aspirin EC 81 MG tablet Take 81 mg by mouth daily.      . benazepril (LOTENSIN) 10 MG tablet Take 1 tablet (10 mg total) by mouth daily. PM  90 tablet  3  . COLCRYS 0.6 MG tablet Take 0.6 mg by mouth 2 (two) times daily.       . metoprolol (TOPROL-XL) 100 MG 24 hr tablet Take 100 mg by mouth daily.       . Multiple Vitamin (MULTIVITAMIN) tablet Take 1 tablet by mouth daily.        . Omega-3 Fatty Acids (FISH OIL) 1200 MG CAPS Take 1 capsule by mouth daily.        . simvastatin (ZOCOR) 20 MG tablet Take 1 tablet (20 mg total) by mouth every evening.  30 tablet  6  . ULORIC 80 MG TABS Take 80 mg by mouth daily.         Past Medical History  Diagnosis Date  . Mixed hyperlipidemia   . Arthritis   . Hypertension   . Carpal tunnel syndrome, bilateral   . Coronary atherosclerosis of native coronary artery     Occluded RCA with collaterals 2004    Social History Ricky Wilson reports that he has never smoked. He has never used smokeless tobacco. Ricky Wilson reports that he drinks alcohol.  Review of Systems No palpitations, no reported bleeding problems. No syncope. Stable appetite. Otherwise negative.  Physical Examination Filed Vitals:   11/11/11 1439  BP: 130/79  Pulse: 66    Normally nourished appearing male in no acute distress.  HEENT: Conjunctiva and lids  normal, oropharynx clear.  Neck: Supple, no elevated jugular venous pressure, no bruits.  Lungs: Clear to auscultation, nonlabored.  Cardiac: Regular rate and rhythm, no S3, no significant murmur.  Abdomen: Soft, nontender, no hepatomegaly, no bruits.  Skin: Warm and dry.  Extremities: No pitting edema, distal pulses 2+.     Problem List and Plan   CAD, NATIVE VESSEL Stable on medical therapy. ECG reviewed. Continue observation. Followup arranged.  ESSENTIAL HYPERTENSION, BENIGN No change to current regimen.  HYPERLIPIDEMIA-MIXED Continues on statin therapy. Keep followup with Dr. Sherril Croon. Goal LDL close to 70.    Ricky Wilson, M.D., F.A.C.C.

## 2011-11-11 NOTE — Assessment & Plan Note (Signed)
Continues on statin therapy. Keep followup with Dr. Sherril Croon. Goal LDL close to 70.

## 2011-11-11 NOTE — Patient Instructions (Addendum)

## 2011-11-11 NOTE — Assessment & Plan Note (Signed)
No change to current regimen. 

## 2012-01-19 ENCOUNTER — Other Ambulatory Visit: Payer: Self-pay | Admitting: Cardiology

## 2012-05-18 ENCOUNTER — Ambulatory Visit: Payer: 59 | Admitting: Cardiology

## 2012-05-18 ENCOUNTER — Encounter: Payer: Self-pay | Admitting: Cardiology

## 2012-05-18 ENCOUNTER — Encounter: Payer: Self-pay | Admitting: *Deleted

## 2012-05-18 ENCOUNTER — Ambulatory Visit (INDEPENDENT_AMBULATORY_CARE_PROVIDER_SITE_OTHER): Payer: 59 | Admitting: Cardiology

## 2012-05-18 VITALS — BP 148/88 | HR 77 | Ht 71.0 in | Wt 219.0 lb

## 2012-05-18 DIAGNOSIS — I10 Essential (primary) hypertension: Secondary | ICD-10-CM

## 2012-05-18 DIAGNOSIS — E785 Hyperlipidemia, unspecified: Secondary | ICD-10-CM

## 2012-05-18 DIAGNOSIS — I2510 Atherosclerotic heart disease of native coronary artery without angina pectoris: Secondary | ICD-10-CM

## 2012-05-18 NOTE — Patient Instructions (Addendum)

## 2012-05-18 NOTE — Assessment & Plan Note (Signed)
Symptomatically stable on medical therapy. Reinforced regular exercise regimen. He has done very well over time.

## 2012-05-18 NOTE — Assessment & Plan Note (Signed)
Mildly elevated blood pressure today. Continue medical therapy, watch sodium, continue exercise.

## 2012-05-18 NOTE — Progress Notes (Signed)
   Clinical Summary Ricky Wilson is a 57 y.o.male presenting for followup. He was last seen in August 2013. He reports no angina symptoms or unusual shortness of breath, main concern has been arthritic pain and gout flares. He sees a rheumatologist and will also be seeing a hand specialist soon.  Dr. Sherril Croon has been following lipids, he has tolerated Zocor well.  Exercise Cardiolite from May 2012 showed no diagnostic ST segment changes, no ischemic perfusion defects, LVEF 58%. We are continuing observation for now.   No Known Allergies  Current Outpatient Prescriptions  Medication Sig Dispense Refill  . ammonium lactate (AMLACTIN) 12 % cream Apply 1 g topically as needed.       Marland Kitchen aspirin EC 81 MG tablet Take 81 mg by mouth daily.      . benazepril (LOTENSIN) 10 MG tablet Take 1 tablet (10 mg total) by mouth daily. PM  90 tablet  3  . COLCRYS 0.6 MG tablet Take 0.6 mg by mouth daily.       . metoprolol succinate (TOPROL-XL) 100 MG 24 hr tablet Take 1 tablet (100 mg total) by mouth daily.  90 tablet  3  . Multiple Vitamin (MULTIVITAMIN) tablet Take 1 tablet by mouth daily.        . Omega-3 Fatty Acids (FISH OIL) 1200 MG CAPS Take 1 capsule by mouth daily.        . simvastatin (ZOCOR) 20 MG tablet TAKE 1 TABLET (20 MG TOTAL) BY MOUTH EVERY EVENING.  30 tablet  6  . ULORIC 80 MG TABS Take 80 mg by mouth daily.        No current facility-administered medications for this visit.    Past Medical History  Diagnosis Date  . Mixed hyperlipidemia   . Arthritis   . Hypertension   . Carpal tunnel syndrome, bilateral   . Coronary atherosclerosis of native coronary artery     Occluded RCA with collaterals 2004    Social History Ricky Wilson reports that he has never smoked. He has never used smokeless tobacco. Ricky Wilson reports that  drinks alcohol.  Review of Systems Negative except as outlined.  Physical Examination Filed Vitals:   05/18/12 0805  BP: 148/88  Pulse: 77   Filed Weights     05/18/12 0805  Weight: 219 lb (99.338 kg)    Normally nourished appearing male in no acute distress.  HEENT: Conjunctiva and lids normal, oropharynx clear.  Neck: Supple, no elevated jugular venous pressure, no bruits.  Lungs: Clear to auscultation, nonlabored.  Cardiac: Regular rate and rhythm, no S3, no significant murmur.  Abdomen: Soft, nontender, no hepatomegaly, no bruits.  Skin: Warm and dry.  Extremities: No pitting edema, distal pulses 2+.    Problem List and Plan   CAD, NATIVE VESSEL Symptomatically stable on medical therapy. Reinforced regular exercise regimen. He has done very well over time.  HYPERLIPIDEMIA-MIXED Keep followup with Dr. Sherril Croon, continue statin. Goal LDL should be close to 70 if possible.  ESSENTIAL HYPERTENSION, BENIGN Mildly elevated blood pressure today. Continue medical therapy, watch sodium, continue exercise.    Jonelle Sidle, M.D., F.A.C.C.

## 2012-05-18 NOTE — Assessment & Plan Note (Signed)
Keep followup with Dr. Sherril Croon, continue statin. Goal LDL should be close to 70 if possible.

## 2012-06-04 ENCOUNTER — Other Ambulatory Visit: Payer: Self-pay | Admitting: Cardiology

## 2012-06-04 MED ORDER — BENAZEPRIL HCL 10 MG PO TABS: 10.0000 mg | ORAL_TABLET | Freq: Every day | ORAL | Status: DC

## 2012-09-24 ENCOUNTER — Other Ambulatory Visit: Payer: Self-pay | Admitting: Cardiology

## 2012-09-24 MED ORDER — SIMVASTATIN 20 MG PO TABS: 20.0000 mg | ORAL_TABLET | Freq: Every day | ORAL | Status: DC

## 2012-10-19 ENCOUNTER — Other Ambulatory Visit: Payer: Self-pay | Admitting: Cardiology

## 2012-10-19 MED ORDER — SIMVASTATIN 20 MG PO TABS: 20.0000 mg | ORAL_TABLET | Freq: Every day | ORAL | Status: DC

## 2012-10-22 ENCOUNTER — Other Ambulatory Visit: Payer: Self-pay | Admitting: Cardiology

## 2012-10-22 MED ORDER — SIMVASTATIN 20 MG PO TABS: 20.0000 mg | ORAL_TABLET | Freq: Every day | ORAL | Status: DC

## 2012-11-15 ENCOUNTER — Ambulatory Visit (INDEPENDENT_AMBULATORY_CARE_PROVIDER_SITE_OTHER): Payer: 59 | Admitting: Cardiology

## 2012-11-15 ENCOUNTER — Encounter: Payer: Self-pay | Admitting: Cardiology

## 2012-11-15 VITALS — BP 144/82 | HR 59 | Ht 70.0 in | Wt 222.0 lb

## 2012-11-15 DIAGNOSIS — I10 Essential (primary) hypertension: Secondary | ICD-10-CM

## 2012-11-15 DIAGNOSIS — I2510 Atherosclerotic heart disease of native coronary artery without angina pectoris: Secondary | ICD-10-CM

## 2012-11-15 DIAGNOSIS — E785 Hyperlipidemia, unspecified: Secondary | ICD-10-CM

## 2012-11-15 NOTE — Progress Notes (Signed)
   Clinical Summary Ricky Wilson is a 57 y.o.male last seen in February of this year. He continues to do well, no active angina, NYHA class 1-2 dyspnea. Still working full-time at [employer].  Exercise Cardiolite from May 2012 showed no diagnostic ST segment changes, no ischemic perfusion defects, LVEF 58%. We are continuing observation for now.  He reports compliance with his medications, lipid followup with Dr. Sherril Croon.  ECG today shows sinus rhythm with Q in lead 3, nonspecific IVCD.  No Known Allergies  Current Outpatient Prescriptions  Medication Sig Dispense Refill  . ammonium lactate (AMLACTIN) 12 % cream Apply 1 g topically as needed.       Marland Kitchen aspirin EC 81 MG tablet Take 81 mg by mouth daily.      . benazepril (LOTENSIN) 10 MG tablet Take 1 tablet (10 mg total) by mouth daily. PM  90 tablet  3  . COLCRYS 0.6 MG tablet Take 0.6 mg by mouth daily.       . metoprolol succinate (TOPROL-XL) 100 MG 24 hr tablet Take 1 tablet (100 mg total) by mouth daily.  90 tablet  3  . Multiple Vitamin (MULTIVITAMIN) tablet Take 1 tablet by mouth daily.        . Omega-3 Fatty Acids (FISH OIL) 1200 MG CAPS Take 1 capsule by mouth daily.        . simvastatin (ZOCOR) 20 MG tablet Take 1 tablet (20 mg total) by mouth daily.  30 tablet  3  . ULORIC 80 MG TABS Take 80 mg by mouth daily.        No current facility-administered medications for this visit.    Past Medical History  Diagnosis Date  . Mixed hyperlipidemia   . Arthritis   . Hypertension   . Carpal tunnel syndrome, bilateral   . Coronary atherosclerosis of native coronary artery     Occluded RCA with collaterals 2004    Social History Ricky Wilson reports that he has never smoked. He has never used smokeless tobacco. Ricky Wilson reports that  drinks alcohol.  Review of Systems No palpitations, dizziness, syncope. No claudication. Otherwise negative.  Physical Examination Filed Vitals:   11/15/12 1418  BP: 144/82  Pulse: 59   Filed  Weights   11/15/12 1418  Weight: 222 lb (100.699 kg)    Normally nourished appearing male in no acute distress.  HEENT: Conjunctiva and lids normal, oropharynx clear.  Neck: Supple, no elevated jugular venous pressure, no bruits.  Lungs: Clear to auscultation, nonlabored.  Cardiac: Regular rate and rhythm, no S3, no significant murmur.  Abdomen: Soft, nontender, no hepatomegaly, no bruits.  Skin: Warm and dry.  Extremities: No pitting edema, distal pulses 2+.    Problem List and Plan   CAD, NATIVE VESSEL Continue medical therapy and observation. Followup arranged.  Essential hypertension, benign Keep followup with Dr. Sherril Croon. Encouraged regular exercise regimen as well.  HYPERLIPIDEMIA-MIXED Continues on Zocor and omega-3 supplements.    Jonelle Sidle, M.D., F.A.C.C.

## 2012-11-15 NOTE — Assessment & Plan Note (Signed)
Continues on Zocor and omega-3 supplements.

## 2012-11-15 NOTE — Patient Instructions (Addendum)

## 2012-11-15 NOTE — Assessment & Plan Note (Signed)
Continue medical therapy and observation. Followup arranged. 

## 2012-11-15 NOTE — Assessment & Plan Note (Signed)
Keep followup with Dr. Sherril Croon. Encouraged regular exercise regimen as well.

## 2012-12-08 ENCOUNTER — Other Ambulatory Visit: Payer: Self-pay | Admitting: Cardiology

## 2013-03-11 ENCOUNTER — Telehealth: Payer: Self-pay | Admitting: Cardiology

## 2013-03-11 ENCOUNTER — Other Ambulatory Visit: Payer: Self-pay

## 2013-03-11 MED ORDER — SIMVASTATIN 20 MG PO TABS: 20.0000 mg | ORAL_TABLET | Freq: Every day | ORAL | Status: DC

## 2013-03-11 NOTE — Telephone Encounter (Signed)
Received fax refill request  Rx # [id number] Medication:  Simvastatin 20 mg tablet Qty 30 Sig:  Take one tablet by mouth every day Physician:  Diona Browner

## 2013-04-10 ENCOUNTER — Other Ambulatory Visit: Payer: Self-pay | Admitting: Cardiology

## 2013-04-10 MED ORDER — SIMVASTATIN 20 MG PO TABS: 20.0000 mg | ORAL_TABLET | Freq: Every day | ORAL | Status: DC

## 2013-05-14 ENCOUNTER — Ambulatory Visit: Payer: 59 | Admitting: Cardiology

## 2013-06-06 ENCOUNTER — Other Ambulatory Visit: Payer: Self-pay | Admitting: *Deleted

## 2013-06-06 MED ORDER — METOPROLOL SUCCINATE ER 100 MG PO TB24: 100.0000 mg | ORAL_TABLET | Freq: Every day | ORAL | Status: DC

## 2013-07-01 ENCOUNTER — Ambulatory Visit: Payer: 59 | Admitting: Cardiology

## 2013-07-26 ENCOUNTER — Other Ambulatory Visit: Payer: Self-pay | Admitting: *Deleted

## 2013-07-26 ENCOUNTER — Other Ambulatory Visit: Payer: Self-pay | Admitting: Cardiology

## 2013-07-26 MED ORDER — BENAZEPRIL HCL 10 MG PO TABS: 10.0000 mg | ORAL_TABLET | Freq: Every evening | ORAL | Status: DC

## 2013-07-26 MED ORDER — METOPROLOL SUCCINATE ER 100 MG PO TB24: 100.0000 mg | ORAL_TABLET | Freq: Every day | ORAL | Status: DC

## 2013-08-13 ENCOUNTER — Encounter: Payer: Self-pay | Admitting: Cardiology

## 2013-08-13 ENCOUNTER — Ambulatory Visit (INDEPENDENT_AMBULATORY_CARE_PROVIDER_SITE_OTHER): Payer: 59 | Admitting: Cardiology

## 2013-08-13 VITALS — BP 151/81 | HR 61 | Ht 71.0 in | Wt 216.1 lb

## 2013-08-13 DIAGNOSIS — E785 Hyperlipidemia, unspecified: Secondary | ICD-10-CM

## 2013-08-13 DIAGNOSIS — I2510 Atherosclerotic heart disease of native coronary artery without angina pectoris: Secondary | ICD-10-CM

## 2013-08-13 NOTE — Assessment & Plan Note (Signed)
Continue medical therapy and observation. Followup arranged. 

## 2013-08-13 NOTE — Assessment & Plan Note (Signed)
Continue simvastatin, followup lab work with Dr. Woody Seller in June.

## 2013-08-13 NOTE — Progress Notes (Signed)
    Clinical Summary  Mr. Sigal is a 58 y.o.male last seen in August 2014. From a cardiac perspective he has been doing well, no angina or shortness of breath. Has not been exercising as regularly, states that he has had some intercostal nerve pain, has been treated with physical therapy and also has seen a pain specialist. May eventually need an intercostal nerve block.  He reports no change in his medications, has been tolerating simvastatin. He will be seeing Dr. Woody Seller in June for a physical with lab work.  Exercise Cardiolite from May 2012 showed no diagnostic ST segment changes, no ischemic perfusion defects, LVEF 58%. We are continuing observation for now.   No Known Allergies  Current Outpatient Prescriptions  Medication Sig Dispense Refill  . aspirin EC 81 MG tablet Take 81 mg by mouth daily.      . benazepril (LOTENSIN) 10 MG tablet Take 1 tablet (10 mg total) by mouth every evening.  90 tablet  3  . metoprolol succinate (TOPROL-XL) 100 MG 24 hr tablet Take 50 mg by mouth daily. Take with or immediately following a meal.      . Multiple Vitamin (MULTIVITAMIN) tablet Take 1 tablet by mouth daily.        . Omega-3 Fatty Acids (FISH OIL) 1200 MG CAPS Take 1 capsule by mouth daily.        . simvastatin (ZOCOR) 20 MG tablet Take 1 tablet (20 mg total) by mouth daily.  30 tablet  3  . ULORIC 80 MG TABS Take 40 mg by mouth daily.        No current facility-administered medications for this visit.    Past Medical History  Diagnosis Date  . Mixed hyperlipidemia   . Arthritis   . Hypertension   . Carpal tunnel syndrome, bilateral   . Coronary atherosclerosis of native coronary artery     Occluded RCA with collaterals 2004    Social History Mr. Kroeker reports that he has never smoked. He has never used smokeless tobacco. Mr. Flippen reports that he drinks alcohol.  Review of Systems Negative except as outlined.  Physical Examination Filed Vitals:   08/13/13 1612  BP: 151/81    Pulse: 61   Filed Weights   08/13/13 1612  Weight: 216 lb 1.9 oz (98.031 kg)    Normally nourished appearing male in no acute distress.  HEENT: Conjunctiva and lids normal, oropharynx clear.  Neck: Supple, no elevated jugular venous pressure, no bruits.  Lungs: Clear to auscultation, nonlabored.  Cardiac: Regular rate and rhythm, no S3, no significant murmur.  Abdomen: Soft, nontender, no hepatomegaly, no bruits.  Skin: Warm and dry.  Extremities: No pitting edema, distal pulses 2+.    Problem List and Plan   CAD, NATIVE VESSEL Continue medical therapy and observation. Followup arranged.  HYPERLIPIDEMIA-MIXED Continue simvastatin, followup lab work with Dr. Woody Seller in June.    Satira Sark, M.D., F.A.C.C.

## 2013-08-13 NOTE — Patient Instructions (Signed)

## 2013-11-20 ENCOUNTER — Other Ambulatory Visit: Payer: Self-pay | Admitting: *Deleted

## 2013-11-20 MED ORDER — SIMVASTATIN 20 MG PO TABS: 20.0000 mg | ORAL_TABLET | Freq: Every day | ORAL | Status: DC

## 2014-01-27 ENCOUNTER — Ambulatory Visit (INDEPENDENT_AMBULATORY_CARE_PROVIDER_SITE_OTHER): Payer: 59 | Admitting: Cardiology

## 2014-01-27 ENCOUNTER — Encounter: Payer: Self-pay | Admitting: Cardiology

## 2014-01-27 VITALS — BP 155/85 | HR 60 | Ht 71.0 in | Wt 223.8 lb

## 2014-01-27 DIAGNOSIS — I2510 Atherosclerotic heart disease of native coronary artery without angina pectoris: Secondary | ICD-10-CM

## 2014-01-27 DIAGNOSIS — I10 Essential (primary) hypertension: Secondary | ICD-10-CM

## 2014-01-27 DIAGNOSIS — E782 Mixed hyperlipidemia: Secondary | ICD-10-CM

## 2014-01-27 NOTE — Assessment & Plan Note (Signed)
No active angina symptoms on medical therapy. ECG stable. No change in current regimen. We discussed diet and exercise. Follow-up planned in 6 months.

## 2014-01-27 NOTE — Assessment & Plan Note (Signed)
He continues on Zocor, recent LDL 65.

## 2014-01-27 NOTE — Progress Notes (Signed)
   Reason for visit: CAD  Clinical Summary Mr. Tukes is a 58 y.o.male last seen in May. He reports no angina symptoms. Has not been exercising regularly until the last month, had been having problems with arthritis and gout. His weight has gone up, but he seems to be focused on trying to lose 10-20 pounds through diet and exercise.  He reports compliance with his medications. Continues on aspirin, beta blocker, ACE inhibitor, and statin. ECG today shows sinus bradycardia.  Lab work from June showed hemoglobin 15, platelets 150, BUN 12, creatinine 1.1, potassium 4.4, AST 32, ALT 50, cholesterol 173, triglycerides 364, HDL 35, LDL 65.  Exercise Cardiolite from May 2012 showed no diagnostic ST segment changes, no ischemic perfusion defects, LVEF 58%.   No Known Allergies  Current Outpatient Prescriptions  Medication Sig Dispense Refill  . aspirin EC 81 MG tablet Take 81 mg by mouth daily.    . benazepril (LOTENSIN) 10 MG tablet Take 1 tablet (10 mg total) by mouth every evening. 90 tablet 3  . metoprolol succinate (TOPROL-XL) 100 MG 24 hr tablet Take 50 mg by mouth daily. Take with or immediately following a meal.    . Multiple Vitamin (MULTIVITAMIN) tablet Take 1 tablet by mouth daily.      . Omega-3 Fatty Acids (FISH OIL) 1200 MG CAPS Take 1 capsule by mouth daily.      . simvastatin (ZOCOR) 20 MG tablet Take 1 tablet (20 mg total) by mouth daily. 30 tablet 6  . ULORIC 80 MG TABS Take 40 mg by mouth daily.      No current facility-administered medications for this visit.    Past Medical History  Diagnosis Date  . Mixed hyperlipidemia   . Arthritis   . Hypertension   . Carpal tunnel syndrome, bilateral   . Coronary atherosclerosis of native coronary artery     Occluded RCA with collaterals 2004    Social History Mr. Holliman reports that he has never smoked. He has never used smokeless tobacco. Mr. Propes reports that he drinks alcohol.  Review of Systems Complete review of  systems negative except as otherwise outlined in the clinical summary.  Physical Examination Filed Vitals:   01/27/14 1623  BP: 155/85  Pulse: 60   Filed Weights   01/27/14 1623  Weight: 223 lb 12.8 oz (101.515 kg)    Normally nourished appearing male in no acute distress.  HEENT: Conjunctiva and lids normal, oropharynx clear.  Neck: Supple, no elevated jugular venous pressure, no bruits.  Lungs: Clear to auscultation, nonlabored.  Cardiac: Regular rate and rhythm, no S3, no significant murmur.  Abdomen: Soft, nontender, no hepatomegaly, no bruits.  Skin: Warm and dry.  Extremities: No pitting edema, distal pulses 2+.    Problem List and Plan   CAD, NATIVE VESSEL No active angina symptoms on medical therapy. ECG stable. No change in current regimen. We discussed diet and exercise. Follow-up planned in 6 months.  Essential hypertension, benign Blood pressure mildly elevated today. Focus on diet and weight loss along with exercise. Oh changed to current regimen.  Mixed hyperlipidemia He continues on Zocor, recent LDL 65.    Satira Sark, M.D., F.A.C.C.

## 2014-01-27 NOTE — Patient Instructions (Signed)
Continue all current medications. Your physician wants you to follow up in: 6 months.  You will receive a reminder letter in the mail one-two months in advance.  If you don't receive a letter, please call our office to schedule the follow up appointment   

## 2014-01-27 NOTE — Assessment & Plan Note (Signed)
Blood pressure mildly elevated today. Focus on diet and weight loss along with exercise. Oh changed to current regimen.

## 2014-06-04 ENCOUNTER — Telehealth: Payer: Self-pay | Admitting: *Deleted

## 2014-06-04 MED ORDER — SIMVASTATIN 20 MG PO TABS: 20.0000 mg | ORAL_TABLET | Freq: Every day | ORAL | Status: DC

## 2014-06-04 NOTE — Telephone Encounter (Signed)
Refill request from [facility] [place] simvastatin 20 mg. Medication sent to pharmacy.

## 2014-07-01 ENCOUNTER — Other Ambulatory Visit: Payer: Self-pay | Admitting: *Deleted

## 2014-07-01 MED ORDER — METOPROLOL SUCCINATE ER 100 MG PO TB24: ORAL_TABLET | ORAL | Status: DC

## 2014-07-09 ENCOUNTER — Other Ambulatory Visit: Payer: Self-pay | Admitting: *Deleted

## 2014-07-09 MED ORDER — BENAZEPRIL HCL 10 MG PO TABS: 10.0000 mg | ORAL_TABLET | Freq: Every evening | ORAL | Status: DC

## 2014-07-24 ENCOUNTER — Ambulatory Visit (INDEPENDENT_AMBULATORY_CARE_PROVIDER_SITE_OTHER): Payer: 59 | Admitting: Cardiology

## 2014-07-24 ENCOUNTER — Encounter: Payer: Self-pay | Admitting: Cardiology

## 2014-07-24 VITALS — BP 132/78 | HR 62 | Ht 71.0 in | Wt 223.4 lb

## 2014-07-24 DIAGNOSIS — E782 Mixed hyperlipidemia: Secondary | ICD-10-CM

## 2014-07-24 DIAGNOSIS — I2510 Atherosclerotic heart disease of native coronary artery without angina pectoris: Secondary | ICD-10-CM

## 2014-07-24 NOTE — Patient Instructions (Signed)
Your physician recommends that you continue on your current medications as directed. Please refer to the Current Medication list given to you today. Your physician recommends that you schedule a follow-up appointment in: 6 months. You will receive a reminder letter in the mail in about 4 months reminding you to call and schedule your appointment. If you don't receive this letter, please contact our office. 

## 2014-07-24 NOTE — Progress Notes (Signed)
Cardiology Office Note  Date: 07/24/2014   ID: [facility] [place], [place] 10-28-1955, MRN [id number]  PCP: Glenda Chroman., MD  Primary Cardiologist: Rozann Lesches, MD   Chief Complaint  Patient presents with  . Coronary Artery Disease  . Hyperlipidemia    History of Present Illness: Ricky Wilson is a 59 y.o. male last seen in November 2015. He is here for a routine visit today. Continues to do well, working full time, also exercises in the mornings. He reports no angina symptoms and NYHA class I dyspnea.  I reviewed his medications which are outlined below. He will have a follow-up with Dr. Woody Seller for physical and lab work in June. Last lipid panel is reviewed below.   Past Medical History  Diagnosis Date  . Mixed hyperlipidemia   . Arthritis   . Hypertension   . Carpal tunnel syndrome, bilateral   . Coronary atherosclerosis of native coronary artery     Occluded RCA with collaterals 2004     Current Outpatient Prescriptions  Medication Sig Dispense Refill  . aspirin EC 81 MG tablet Take 81 mg by mouth daily.    . benazepril (LOTENSIN) 10 MG tablet Take 1 tablet (10 mg total) by mouth every evening. 90 tablet 3  . metoprolol succinate (TOPROL-XL) 100 MG 24 hr tablet Take one-half by mouth daily. Take with or immediately following a meal. 45 tablet 3  . Multiple Vitamin (MULTIVITAMIN) tablet Take 1 tablet by mouth daily.      . Omega-3 Fatty Acids (FISH OIL) 1200 MG CAPS Take 1 capsule by mouth daily.      . simvastatin (ZOCOR) 20 MG tablet Take 1 tablet (20 mg total) by mouth daily. 30 tablet 6  . ULORIC 80 MG TABS Take 40 mg by mouth daily.      No current facility-administered medications for this visit.    Allergies:  Review of patient's allergies indicates no known allergies.   Social History: The patient  reports that he has never smoked. He has never used smokeless tobacco. He reports that he drinks alcohol. He reports that he does not use illicit drugs.      ROS:  Please see the history of present illness. Otherwise, complete review of systems is positive for arthritic pains in his hands.  All other systems are reviewed and negative.   Physical Exam: VS:  BP 132/78 mmHg  Pulse 62  Ht 5\' 11"  (1.803 m)  Wt 223 lb 6.4 oz (101.334 kg)  BMI 31.17 kg/m2  SpO2 96%, BMI Body mass index is 31.17 kg/(m^2).  Wt Readings from Last 3 Encounters:  07/24/14 223 lb 6.4 oz (101.334 kg)  01/27/14 223 lb 12.8 oz (101.515 kg)  08/13/13 216 lb 1.9 oz (98.031 kg)     Normally nourished appearing male in no acute distress.  HEENT: Conjunctiva and lids normal, oropharynx clear.  Neck: Supple, no elevated jugular venous pressure, no bruits.  Lungs: Clear to auscultation, nonlabored.  Cardiac: Regular rate and rhythm, no S3, no significant murmur.  Abdomen: Soft, nontender, no hepatomegaly, no bruits.  Skin: Warm and dry.  Extremities: No pitting edema, distal pulses 2+.    ECG: ECG is not ordered today.   Recent Labwork:  09/06/2013: Hemoglobin 15.0, platelets 150, BUN 12, creatinine 1.1, potassium 4.4, AST 32, ALT 50, cholesterol 173, triglycerides 364, HDL 35, LDL 65, TSH 2.7  Other Studies Reviewed Today:  Exercise Cardiolite from May 2012 showed no diagnostic ST segment changes, no ischemic  perfusion defects, LVEF 58%.   Assessment and Plan:  1. CAD with occluded RCA associated with collaterals, clinically stable on medical therapy. Continue observation, regular exercise plan.  2. Hyperlipidemia, on Zocor, last LDL 65. Keep follow-up for lab work with Dr. Woody Seller in June.  Current medicines were reviewed with the patient today.   Disposition: FU with me in 6 months.   Signed, Satira Sark, MD, [facility] 07/24/2014 4:11 PM    [facility] [place], [place], [place] [address] Phone: [phone]; Fax: [phone]

## 2014-11-18 ENCOUNTER — Other Ambulatory Visit (HOSPITAL_COMMUNITY): Payer: Self-pay | Admitting: Endocrinology

## 2014-11-18 DIAGNOSIS — E210 Primary hyperparathyroidism: Secondary | ICD-10-CM

## 2014-11-28 ENCOUNTER — Encounter (HOSPITAL_COMMUNITY)
Admission: RE | Admit: 2014-11-28 | Discharge: 2014-11-28 | Disposition: A | Discharge status: Home / Self Care | Payer: 59 | Source: Ambulatory Visit | Attending: Endocrinology | Admitting: Endocrinology

## 2014-11-28 ENCOUNTER — Ambulatory Visit (HOSPITAL_COMMUNITY)
Admission: RE | Admit: 2014-11-28 | Discharge: 2014-11-28 | Disposition: A | Discharge status: Home / Self Care | Payer: 59 | Source: Ambulatory Visit | Attending: Endocrinology | Admitting: Endocrinology

## 2014-11-28 DIAGNOSIS — E210 Primary hyperparathyroidism: Secondary | ICD-10-CM | POA: Insufficient documentation

## 2014-11-28 MED ORDER — TECHNETIUM TC 99M SESTAMIBI - CARDIOLITE
26.7000 | Freq: Once | INTRAVENOUS | Status: AC | PRN
  Administered 2014-11-28: 27 via INTRAVENOUS

## 2014-12-22 ENCOUNTER — Other Ambulatory Visit: Payer: Self-pay | Admitting: *Deleted

## 2014-12-22 ENCOUNTER — Encounter: Payer: Self-pay | Admitting: *Deleted

## 2014-12-22 MED ORDER — SIMVASTATIN 20 MG PO TABS: 20.0000 mg | ORAL_TABLET | Freq: Every day | ORAL | Status: DC

## 2014-12-25 ENCOUNTER — Other Ambulatory Visit: Payer: Self-pay | Admitting: *Deleted

## 2014-12-25 MED ORDER — METOPROLOL SUCCINATE ER 100 MG PO TB24: ORAL_TABLET | ORAL | Status: DC

## 2015-01-12 ENCOUNTER — Encounter: Payer: Self-pay | Admitting: Cardiology

## 2015-01-12 ENCOUNTER — Ambulatory Visit (INDEPENDENT_AMBULATORY_CARE_PROVIDER_SITE_OTHER): Payer: 59 | Admitting: Cardiology

## 2015-01-12 VITALS — BP 132/80 | HR 65 | Ht 71.0 in | Wt 220.0 lb

## 2015-01-12 DIAGNOSIS — I2510 Atherosclerotic heart disease of native coronary artery without angina pectoris: Secondary | ICD-10-CM | POA: Diagnosis not present

## 2015-01-12 DIAGNOSIS — E782 Mixed hyperlipidemia: Secondary | ICD-10-CM | POA: Diagnosis not present

## 2015-01-12 DIAGNOSIS — I10 Essential (primary) hypertension: Secondary | ICD-10-CM | POA: Diagnosis not present

## 2015-01-12 NOTE — Progress Notes (Signed)
Cardiology Office Note  Date: 01/12/2015   ID: [name], [place] Feb 26, 1956, MRN [id number]  PCP: Glenda Chroman., MD  Primary Cardiologist: Rozann Lesches, MD   Chief Complaint  Patient presents with  . Coronary Artery Disease    History of Present Illness: Ricky Wilson is a 59 y.o. male last seen in April. He presents for a routine follow-up visit. Since we last met, he has retired. He has been walking 2 miles each morning with his wife for exercise, reports no angina symptoms or unusual shortness of breath.  We reviewed his medications which are outlined below, he reports no changes. He had follow-up with Dr. Woody Seller back in June at which time lab work was obtained. We are requesting the results for review. He tells me that he was diagnosed with hypercalcemia and eventually found to have hyperparathyroidism that is being followed by Dr. Chalmers Cater. He does not report any obvious symptoms associated with this however.  Follow-up ECG today is normal.  He continues on Zocor and omega-3 supplements, reports no intolerances.  He does plan to increase his regular exercise, however is limited by arthritic shoulder pains , but still plans to do aerobic activity such as walking and might try yoga.  Past Medical History  Diagnosis Date  . Mixed hyperlipidemia   . Arthritis   . Hypertension   . Carpal tunnel syndrome, bilateral   . Coronary atherosclerosis of native coronary artery     Occluded RCA with collaterals 2004    Past Surgical History  Procedure Laterality Date  . Vasectomy  1977  . Torn left medial meniscus  2008  . Elbow surgery  2008    right  . Carpal tunnel release  03/31/2011    Procedure: CARPAL TUNNEL RELEASE;  Surgeon: Cammie Sickle., MD;  Location: [place];  Service: Orthopedics;  Laterality: Left;  . Carpal tunnel release  05/17/2011    Procedure: CARPAL TUNNEL RELEASE;  Surgeon: Cammie Sickle., MD;  Location: [place];  Service: Orthopedics;  Laterality: Right;    Current Outpatient Prescriptions  Medication Sig Dispense Refill  . aspirin EC 81 MG tablet Take 81 mg by mouth daily.    . benazepril (LOTENSIN) 10 MG tablet Take 1 tablet (10 mg total) by mouth every evening. 90 tablet 3  . metoprolol succinate (TOPROL-XL) 100 MG 24 hr tablet Take one-half by mouth daily. Take with or immediately following a meal. 45 tablet 3  . Multiple Vitamin (MULTIVITAMIN) tablet Take 1 tablet by mouth daily.      . Omega-3 Fatty Acids (FISH OIL) 1200 MG CAPS Take 1 capsule by mouth daily.      . simvastatin (ZOCOR) 20 MG tablet Take 1 tablet (20 mg total) by mouth daily. 30 tablet 6  . ULORIC 80 MG TABS Take 40 mg by mouth daily.      No current facility-administered medications for this visit.    Allergies:  Hydrocodone   Social History: The patient  reports that he has never smoked. He has never used smokeless tobacco. He reports that he drinks alcohol. He reports that he does not use illicit drugs.   ROS:  Please see the history of present illness. Otherwise, complete review of systems is positive for chronic shoulder discomfort, arthritic pains.  All other systems are reviewed and negative.   Physical Exam: VS:  BP 132/80 mmHg  Pulse 65  Ht 5' 11" (1.803 m)  [employer] [facility]  220 lb (99.791 kg)  BMI 30.70 kg/m2  SpO2 95%, BMI Body mass index is 30.7 kg/(m^2).  Wt Readings from Last 3 Encounters:  01/12/15 220 lb (99.791 kg)  07/24/14 223 lb 6.4 oz (101.334 kg)  01/27/14 223 lb 12.8 oz (101.515 kg)     Normally nourished appearing male in no acute distress.  HEENT: Conjunctiva and lids normal, oropharynx clear.  Neck: Supple, no elevated jugular venous pressure, no bruits.  Lungs: Clear to auscultation, nonlabored.  Cardiac: Regular rate and rhythm, no S3, no significant murmur.  Abdomen: Soft, nontender, no hepatomegaly, no bruits.  Skin: Warm and dry.  Extremities: No pitting edema, distal  pulses 2+. Arthritic changes noted in his hands.  musculoskeletal: No kyphosis. Neuropsychiatric: Alert and oriented 3, affect appropriate.   ECG: ECG is ordered today.   Recent Labwork:  June 2015: Hemoglobin 15.0, platelets 150 , BUN 12, creatinine 1.1 , potassium 4.4, AST 32, ALT 50, cholesterol 173, triglycerides 364, HDL 35, LDL 65, TSH 2.7  Other Studies Reviewed Today:  Exercise Cardiolite from May 2012 showed no diagnostic ST segment changes, no ischemic perfusion defects, LVEF 58%.   Assessment and Plan:  1. Symptomatically stable CAD with known occlusion of the RCA associated with collaterals. No clear indication for follow-up stress testing at this time. I recommended continued regular exercise plan. Follow-up ECG is normal. No changes made to current regimen.  2. Hyperlipidemia, on Zocor omega-3 supplement. Requesting most recent lab work from Dr. Vyas. Last LDL 65.  3. Essential hypertension, blood pressure control is reasonable today.  Current medicines were reviewed with the patient today.   Orders Placed This Encounter  Procedures  . EKG 12-Lead    Disposition: FU with me in 6 months.   Signed, Samuel G. McDowell, MD, FACC 01/12/2015 10:33 AM    [facility] [place] [address], [place], [place] [address] Phone: [phone]; Fax: [phone]  

## 2015-01-12 NOTE — Patient Instructions (Signed)
Your physician recommends that you continue on your current medications as directed. Please refer to the Current Medication list given to you today. Your physician recommends that you schedule a follow-up appointment in: 6 months. You will receive a reminder letter in the mail in about 4 months reminding you to call and schedule your appointment. If you don't receive this letter, please contact our office. 

## 2015-05-20 ENCOUNTER — Other Ambulatory Visit: Payer: Self-pay | Admitting: *Deleted

## 2015-05-20 MED ORDER — SIMVASTATIN 20 MG PO TABS: 20.0000 mg | ORAL_TABLET | Freq: Every day | ORAL | Status: DC

## 2015-06-24 ENCOUNTER — Other Ambulatory Visit: Payer: Self-pay

## 2015-06-24 MED ORDER — BENAZEPRIL HCL 10 MG PO TABS: 10.0000 mg | ORAL_TABLET | Freq: Every evening | ORAL | Status: DC

## 2015-07-17 ENCOUNTER — Ambulatory Visit (INDEPENDENT_AMBULATORY_CARE_PROVIDER_SITE_OTHER): Payer: 59 | Admitting: Cardiology

## 2015-07-17 ENCOUNTER — Encounter: Payer: Self-pay | Admitting: Cardiology

## 2015-07-17 VITALS — BP 135/76 | HR 68 | Ht 71.0 in | Wt 218.8 lb

## 2015-07-17 DIAGNOSIS — I10 Essential (primary) hypertension: Secondary | ICD-10-CM | POA: Diagnosis not present

## 2015-07-17 DIAGNOSIS — E782 Mixed hyperlipidemia: Secondary | ICD-10-CM | POA: Diagnosis not present

## 2015-07-17 DIAGNOSIS — I2510 Atherosclerotic heart disease of native coronary artery without angina pectoris: Secondary | ICD-10-CM | POA: Diagnosis not present

## 2015-07-17 NOTE — Patient Instructions (Signed)
Your physician recommends that you continue on your current medications as directed. Please refer to the Current Medication list given to you today. Your physician recommends that you schedule a follow-up appointment in: 6 months. You will receive a reminder letter in the mail in about 4 months reminding you to call and schedule your appointment. If you don't receive this letter, please contact our office. 

## 2015-07-17 NOTE — Progress Notes (Signed)
Cardiology Office Note  Date: 07/17/2015   ID: [facility] [place], [place] 12-22-1955, MRN [id number]  PCP: Glenda Chroman, MD  Primary Cardiologist: Rozann Lesches, MD   Chief Complaint  Patient presents with  . Coronary Artery Disease    History of Present Illness: Ricky Wilson is a 60 y.o. male last seen in October 2016. He presents for a routine follow-up visit. Continues to enjoy his retirement, stays busy with activities that he enjoys, exercising regularly as well. He reports NYHA class 1-2 dyspnea, no exertional chest pain or palpitations.  We have held off on stress testing since 2012. We discussed this issue today. Possibility of follow-up surveillance testing was reviewed, however he has had no significant angina and feels well at this time. We have elected to continue observation for now.  He will be seeing Dr. Woody Seller for a physical with lab work in June. Generally his cholesterol has been well controlled on statin therapy.  Past Medical History  Diagnosis Date  . Mixed hyperlipidemia   . Arthritis   . Hypertension   . Carpal tunnel syndrome, bilateral   . Coronary atherosclerosis of native coronary artery     Occluded RCA with collaterals 2004    Past Surgical History  Procedure Laterality Date  . Vasectomy  1977  . Torn left medial meniscus  2008  . Elbow surgery  2008    right  . Carpal tunnel release  03/31/2011    Procedure: CARPAL TUNNEL RELEASE;  Surgeon: Cammie Sickle., MD;  Location: [place];  Service: Orthopedics;  Laterality: Left;  . Carpal tunnel release  05/17/2011    Procedure: CARPAL TUNNEL RELEASE;  Surgeon: Cammie Sickle., MD;  Location: [place];  Service: Orthopedics;  Laterality: Right;    Current Outpatient Prescriptions  Medication Sig Dispense Refill  . aspirin EC 81 MG tablet Take 81 mg by mouth daily.    . benazepril (LOTENSIN) 10 MG tablet Take 1 tablet (10 mg total) by mouth every  evening. 90 tablet 3  . metoprolol succinate (TOPROL-XL) 100 MG 24 hr tablet Take one-half by mouth daily. Take with or immediately following a meal. 45 tablet 3  . Multiple Vitamin (MULTIVITAMIN) tablet Take 1 tablet by mouth daily.      . Omega-3 Fatty Acids (FISH OIL) 1200 MG CAPS Take 1 capsule by mouth daily.      . simvastatin (ZOCOR) 20 MG tablet Take 1 tablet (20 mg total) by mouth daily. 30 tablet 6  . ULORIC 80 MG TABS Take 40 mg by mouth daily.      No current facility-administered medications for this visit.   Allergies:  Hydrocodone   Social History: The patient  reports that he has never smoked. He has never used smokeless tobacco. He reports that he drinks alcohol. He reports that he does not use illicit drugs.   ROS:  Please see the history of present illness. Otherwise, complete review of systems is positive for chronic right shoulder pain.  All other systems are reviewed and negative.   Physical Exam: VS:  BP 135/76 mmHg  Pulse 68  Ht 5\' 11"  (1.803 m)  Wt 218 lb 12.8 oz (99.247 kg)  BMI 30.53 kg/m2  SpO2 97%, BMI Body mass index is 30.53 kg/(m^2).  Wt Readings from Last 3 Encounters:  07/17/15 218 lb 12.8 oz (99.247 kg)  01/12/15 220 lb (99.791 kg)  07/24/14 223 lb 6.4 oz (101.334 kg)  Normally nourished appearing male in no acute distress.  HEENT: Conjunctiva and lids normal, oropharynx clear.  Neck: Supple, no elevated jugular venous pressure, no bruits.  Lungs: Clear to auscultation, nonlabored.  Cardiac: Regular rate and rhythm, no S3, no significant murmur.  Abdomen: Soft, nontender, no hepatomegaly, no bruits.  Skin: Warm and dry.  Extremities: No pitting edema, distal pulses 2+. Arthritic changes noted in his hands. musculoskeletal: No kyphosis. Neuropsychiatric: Alert and oriented 3, affect appropriate.  ECG: I personally reviewed the prior tracing from 01/12/2015 which showed normal sinus rhythm.  Recent Labwork:  June 2015: Cholesterol  173, triglycerides 364, HDL 35, LDL 65, TSH 2.7, AST 32, ALT 50  Other Studies Reviewed Today:  Exercise Cardiolite May 2012: No diagnostic ST segment changes, no ischemic perfusion defects, LVEF 58%.   Assessment and Plan:  1. Symptomatically stable CAD with known occlusion of the RCA associate with collaterals, no active angina on medical therapy. He has good exercise tolerance based on description of his activities and outdoor work. We will continue observation for now. We did discuss warning signs and symptoms that would prompt further ischemic evaluation.  2. Hyperlipidemia, LDL has generally been well controlled on statin therapy. He continues on Zocor without obvious side effects. He is on omega-3 supplements with history of elevated triglycerides as well.  3. Essential hypertension, blood pressure is adequately controlled today.  Current medicines were reviewed with the patient today.  Disposition: FU with me in 6 months.   Signed, Satira Sark, MD, [facility] 07/17/2015 8:15 AM    [facility] [place], [place], [place] [address] Phone: [phone]; Fax: [phone]

## 2015-12-04 ENCOUNTER — Other Ambulatory Visit: Payer: Self-pay | Admitting: Cardiology

## 2015-12-21 ENCOUNTER — Other Ambulatory Visit: Payer: Self-pay | Admitting: *Deleted

## 2015-12-21 MED ORDER — METOPROLOL SUCCINATE ER 100 MG PO TB24: ORAL_TABLET | ORAL | 3 refills | Status: DC

## 2016-01-12 ENCOUNTER — Encounter: Payer: Self-pay | Admitting: *Deleted

## 2016-01-12 NOTE — Progress Notes (Signed)
Cardiology Office Note  Date: 01/13/2016   ID: Ricky Wilson, [place] Jun 13, 1955, [name]  PCP: Glenda Chroman, MD  Primary Cardiologist: Rozann Lesches, MD   Chief Complaint  Patient presents with  . Coronary Artery Disease    History of Present Illness: Ricky Wilson is a 60 y.o. male last seen in April 2017. He presents for a routine follow-up visit. Continues to do well, enjoying retirement. He has been doing some work on a home that his daughter her chest. Still exercising. He does not report any angina and has stable NYHA class II dyspnea.  I reviewed his recent lab work from June as outlined below. Zocor has been on hold with mildly abnormal LFTs per Dr. Woody Seller. He has repeat lab work pending. He does not report any abdominal pain, no nausea, no change in bowel pattern.  ECG today shows sinus rhythm with Q in lead III. His last stress test was in 2012. We discussed arranging a follow-up stress test for new baseline.  Past Medical History:  Diagnosis Date  . Arthritis   . Carpal tunnel syndrome, bilateral   . Coronary atherosclerosis of native coronary artery    Occluded RCA with collaterals 2004  . Hypertension   . Mixed hyperlipidemia     Past Surgical History:  Procedure Laterality Date  . CARPAL TUNNEL RELEASE  03/31/2011   Procedure: CARPAL TUNNEL RELEASE;  Surgeon: Cammie Sickle., MD;  Location: [place];  Service: Orthopedics;  Laterality: Left;  . CARPAL TUNNEL RELEASE  05/17/2011   Procedure: CARPAL TUNNEL RELEASE;  Surgeon: Cammie Sickle., MD;  Location: [place];  Service: Orthopedics;  Laterality: Right;  . ELBOW SURGERY  2008   right  . TORN LEFT MEDIAL MENISCUS  2008  . VASECTOMY  1977    Current Outpatient Prescriptions  Medication Sig Dispense Refill  . aspirin EC 81 MG tablet Take 81 mg by mouth daily.    . benazepril (LOTENSIN) 10 MG tablet Take 1 tablet (10 mg total) by mouth every  evening. 90 tablet 3  . metoprolol succinate (TOPROL-XL) 100 MG 24 hr tablet Take one-half by mouth daily. Take with or immediately following a meal. 45 tablet 3  . Multiple Vitamin (MULTIVITAMIN) tablet Take 1 tablet by mouth daily.      . Omega-3 Fatty Acids (FISH OIL) 1200 MG CAPS Take 1 capsule by mouth daily.      Marland Kitchen ULORIC 80 MG TABS Take 40 mg by mouth daily.     . simvastatin (ZOCOR) 20 MG tablet TAKE 1 TABLET (20 MG TOTAL) BY MOUTH DAILY. (Patient not taking: Reported on 01/13/2016) 30 tablet 6   No current facility-administered medications for this visit.    Allergies:  Hydrocodone   Social History: The patient  reports that he has never smoked. He has never used smokeless tobacco. He reports that he drinks alcohol. He reports that he does not use drugs.  ROS:  Please see the history of present illness. Otherwise, complete review of systems is positive for none.  All other systems are reviewed and negative.   Physical Exam: VS:  BP 122/68   Pulse 60   Ht 5\' 11"  (1.803 m)   Wt 220 lb (99.8 kg)   BMI 30.68 kg/m , BMI Body mass index is 30.68 kg/m.  Wt Readings from Last 3 Encounters:  01/13/16 220 lb (99.8 kg)  07/17/15 218 lb 12.8 oz (99.2 kg)  01/12/15 220 lb (99.8 kg)    Normally nourished appearing male in no acute distress.  HEENT: Conjunctiva and lids normal, oropharynx clear.  Neck: Supple, no elevated jugular venous pressure, no bruits.  Lungs: Clear to auscultation, nonlabored.  Cardiac: Regular rate and rhythm, no S3, no significant murmur.  Abdomen: Soft, nontender, no hepatomegaly, no bruits.  Skin: Warm and dry.  Extremities: No pitting edema, distal pulses 2+. Arthritic changes noted in his hands. musculoskeletal: No kyphosis. Neuropsychiatric: Alert and oriented 3, affect appropriate.  ECG: I personally reviewed the tracing from 01/12/2015 which showed normal sinus rhythm.  Recent Labwork:  June 2017: Hemoglobin 14.9, platelets 154, BUN 13,  creatinine 1.0, potassium 4.1, AST 41, ALT 60, cholesterol 175, triglycerides 255, HDL 39, LDL 85, TSH 3.5  Other Studies Reviewed Today:  Exercise Cardiolite May 2012: No diagnostic ST segment changes, no ischemic perfusion defects, LVEF 58%.   Assessment and Plan:  1. CAD, known occlusion of the RCA with collaterals as of 2004. He has stable dyspnea on exertion, no obvious angina. ECG reviewed and stable. Last ischemic evaluation was 5 years ago. We will arrange a Lexiscan Myoview on medical therapy to reassess ischemic burden.  2. Essential hypertension, blood pressure well controlled today.  3. Hyperlipidemia, Zocor currently on hold with recent abnormal LFTs, AST 41 and ALT 60. He has repeat lab work pending with Dr. Woody Seller. If LFTs remain abnormal despite holding the medication, may want to refer him for GI assessment - could be hepatic steatosis or other issue.  Current medicines were reviewed with the patient today.   Orders Placed This Encounter  Procedures  . EKG 12-Lead    Disposition: Follow-up with me in 6 months.  Signed, Satira Sark, MD, [facility] 01/13/2016 8:18 AM    [facility] [place], [place], [place] [address] Phone: [phone]; Fax: [phone]

## 2016-01-13 ENCOUNTER — Ambulatory Visit (INDEPENDENT_AMBULATORY_CARE_PROVIDER_SITE_OTHER): Payer: 59 | Admitting: Cardiology

## 2016-01-13 ENCOUNTER — Encounter: Payer: Self-pay | Admitting: Cardiology

## 2016-01-13 ENCOUNTER — Encounter: Payer: Self-pay | Admitting: *Deleted

## 2016-01-13 VITALS — BP 122/68 | HR 60 | Ht 71.0 in | Wt 220.0 lb

## 2016-01-13 DIAGNOSIS — I10 Essential (primary) hypertension: Secondary | ICD-10-CM | POA: Diagnosis not present

## 2016-01-13 DIAGNOSIS — E782 Mixed hyperlipidemia: Secondary | ICD-10-CM | POA: Diagnosis not present

## 2016-01-13 DIAGNOSIS — I25119 Atherosclerotic heart disease of native coronary artery with unspecified angina pectoris: Secondary | ICD-10-CM | POA: Diagnosis not present

## 2016-01-13 DIAGNOSIS — I2510 Atherosclerotic heart disease of native coronary artery without angina pectoris: Secondary | ICD-10-CM | POA: Diagnosis not present

## 2016-01-13 NOTE — Patient Instructions (Signed)
Medication Instructions:  Continue all current medications.  Labwork: none  Testing/Procedures:  Your physician has requested that you have a lexiscan myoview. For further information please visit [email or web address]. Please follow instruction sheet, as given.  Office will contact with results via phone or letter.    Follow-Up: Your physician wants you to follow up in: 6 months.  You will receive a reminder letter in the mail one-two months in advance.  If you don't receive a letter, please call our office to schedule the follow up appointment   Any Other Special Instructions Will Be Listed Below (If Applicable).  If you need a refill on your cardiac medications before your next appointment, please call your pharmacy.  

## 2016-01-21 ENCOUNTER — Encounter (HOSPITAL_COMMUNITY)
Admission: RE | Admit: 2016-01-21 | Discharge: 2016-01-21 | Disposition: A | Discharge status: Home / Self Care | Payer: 59 | Source: Ambulatory Visit | Attending: Cardiology | Admitting: Cardiology

## 2016-01-21 ENCOUNTER — Inpatient Hospital Stay (HOSPITAL_COMMUNITY): Admission: RE | Admit: 2016-01-21 | Payer: 59 | Source: Ambulatory Visit

## 2016-01-21 ENCOUNTER — Encounter (HOSPITAL_COMMUNITY): Payer: Self-pay

## 2016-01-21 DIAGNOSIS — I2510 Atherosclerotic heart disease of native coronary artery without angina pectoris: Secondary | ICD-10-CM

## 2016-01-21 LAB — NM MYOCAR MULTI W/SPECT W/WALL MOTION / EF
CHL CUP NUCLEAR SRS: 0
CHL CUP NUCLEAR SSS: 1
CSEPPHR: 78 {beats}/min
LV dias vol: 110 mL (ref 62–150)
LV sys vol: 46 mL
RATE: 0.34
Rest HR: 56 {beats}/min
SDS: 1
TID: 1.07

## 2016-01-21 MED ORDER — REGADENOSON 0.4 MG/5ML IV SOLN
INTRAVENOUS | Status: AC
  Administered 2016-01-21: 0.4 mg via INTRAVENOUS
  Filled 2016-01-21: qty 5

## 2016-01-21 MED ORDER — SODIUM CHLORIDE 0.9% FLUSH
INTRAVENOUS | Status: AC
  Administered 2016-01-21: 10 mL via INTRAVENOUS
  Filled 2016-01-21: qty 10

## 2016-01-21 MED ORDER — TECHNETIUM TC 99M TETROFOSMIN IV KIT
30.0000 | PACK | Freq: Once | INTRAVENOUS | Status: AC | PRN
  Administered 2016-01-21: 32 via INTRAVENOUS

## 2016-01-21 MED ORDER — TECHNETIUM TC 99M TETROFOSMIN IV KIT
10.0000 | PACK | Freq: Once | INTRAVENOUS | Status: AC | PRN
  Administered 2016-01-21: 11 via INTRAVENOUS

## 2016-01-22 ENCOUNTER — Telehealth: Payer: Self-pay | Admitting: *Deleted

## 2016-01-22 NOTE — Telephone Encounter (Signed)
Patient informed and copy sent to PCP. 

## 2016-01-22 NOTE — Telephone Encounter (Signed)
-----   Message from Satira Sark, MD sent at 01/21/2016  2:53 PM EDT ----- Results reviewed. Low risk stress test results, overall reassuring. We will continue same medications and follow-up plan. A copy of this test should be forwarded to Glenda Chroman, MD.

## 2016-03-09 ENCOUNTER — Other Ambulatory Visit: Payer: Self-pay | Admitting: Rheumatology

## 2016-03-10 LAB — CBC WITH DIFFERENTIAL/PLATELET
BASOS: 1 %
Basophils Absolute: 0 10*3/uL (ref 0.0–0.2)
EOS (ABSOLUTE): 0.2 10*3/uL (ref 0.0–0.4)
EOS: 5 %
HEMATOCRIT: 41.6 % (ref 37.5–51.0)
Hemoglobin: 14.8 g/dL (ref 13.0–17.7)
IMMATURE GRANS (ABS): 0 10*3/uL (ref 0.0–0.1)
IMMATURE GRANULOCYTES: 0 %
LYMPHS: 40 %
Lymphocytes Absolute: 1.8 10*3/uL (ref 0.7–3.1)
MCH: 30.8 pg (ref 26.6–33.0)
MCHC: 35.6 g/dL (ref 31.5–35.7)
MCV: 87 fL (ref 79–97)
Monocytes Absolute: 0.4 10*3/uL (ref 0.1–0.9)
Monocytes: 10 %
NEUTROS PCT: 44 %
Neutrophils Absolute: 2 10*3/uL (ref 1.4–7.0)
Platelets: 141 10*3/uL — ABNORMAL LOW (ref 150–379)
RBC: 4.8 x10E6/uL (ref 4.14–5.80)
RDW: 13.3 % (ref 12.3–15.4)
WBC: 4.5 10*3/uL (ref 3.4–10.8)

## 2016-03-10 LAB — COMPREHENSIVE METABOLIC PANEL
A/G RATIO: 2.5 — AB (ref 1.2–2.2)
ALBUMIN: 4.5 g/dL (ref 3.6–4.8)
ALT: 58 IU/L — ABNORMAL HIGH (ref 0–44)
AST: 35 IU/L (ref 0–40)
Alkaline Phosphatase: 84 IU/L (ref 39–117)
BUN / CREAT RATIO: 11 (ref 10–24)
BUN: 12 mg/dL (ref 8–27)
Bilirubin Total: 0.5 mg/dL (ref 0.0–1.2)
CALCIUM: 10.5 mg/dL — AB (ref 8.6–10.2)
CO2: 25 mmol/L (ref 18–29)
Chloride: 101 mmol/L (ref 96–106)
Creatinine, Ser: 1.06 mg/dL (ref 0.76–1.27)
GFR, EST AFRICAN AMERICAN: 87 mL/min/{1.73_m2} (ref >59)
GFR, EST NON AFRICAN AMERICAN: 75 mL/min/{1.73_m2} (ref >59)
GLOBULIN, TOTAL: 1.8 g/dL (ref 1.5–4.5)
Glucose: 100 mg/dL — ABNORMAL HIGH (ref 65–99)
POTASSIUM: 4.7 mmol/L (ref 3.5–5.2)
SODIUM: 140 mmol/L (ref 134–144)
TOTAL PROTEIN: 6.3 g/dL (ref 6.0–8.5)

## 2016-03-10 LAB — URIC ACID: Uric Acid: 5.2 mg/dL (ref 3.7–8.6)

## 2016-03-11 NOTE — Progress Notes (Signed)
Labs are stable.

## 2016-04-06 ENCOUNTER — Encounter: Payer: Self-pay | Admitting: Rheumatology

## 2016-04-06 ENCOUNTER — Ambulatory Visit (INDEPENDENT_AMBULATORY_CARE_PROVIDER_SITE_OTHER): Payer: 59 | Admitting: Rheumatology

## 2016-04-06 VITALS — BP 142/70 | HR 66 | Resp 14 | Ht 71.0 in | Wt 224.0 lb

## 2016-04-06 DIAGNOSIS — M19041 Primary osteoarthritis, right hand: Secondary | ICD-10-CM | POA: Diagnosis not present

## 2016-04-06 DIAGNOSIS — M19042 Primary osteoarthritis, left hand: Secondary | ICD-10-CM

## 2016-04-06 DIAGNOSIS — M19079 Primary osteoarthritis, unspecified ankle and foot: Secondary | ICD-10-CM | POA: Diagnosis not present

## 2016-04-06 DIAGNOSIS — M1A49X0 Other secondary chronic gout, multiple sites, without tophus (tophi): Secondary | ICD-10-CM | POA: Diagnosis not present

## 2016-04-06 DIAGNOSIS — L509 Urticaria, unspecified: Secondary | ICD-10-CM

## 2016-04-06 MED ORDER — DICLOFENAC SODIUM 1 % TD GEL: TRANSDERMAL | 3 refills | Status: DC

## 2016-04-06 MED ORDER — HYDROXYZINE PAMOATE 25 MG PO CAPS: 25.0000 mg | ORAL_CAPSULE | Freq: Three times a day (TID) | ORAL | 0 refills | Status: DC | PRN

## 2016-04-06 NOTE — Patient Instructions (Signed)
Hand Exercises Introduction Hand exercises can be helpful to almost anyone. These exercises can strengthen the hands, improve flexibility and movement, and increase blood flow to the hands. These results can make work and daily tasks easier. Hand exercises can be especially helpful for people who have joint pain from arthritis or have nerve damage from overuse (carpal tunnel syndrome). These exercises can also help people who have injured a hand. Most of these hand exercises are fairly gentle stretching routines. You can do them often throughout the day. Still, it is a good idea to ask your health care provider which exercises would be best for you. Warming your hands before exercise may help to reduce stiffness. You can do this with gentle massage or by placing your hands in warm water for 15 minutes. Also, make sure you pay attention to your level of hand pain as you begin an exercise routine. Exercises Knuckle Bend  Repeat this exercise 5-10 times with each hand. 1. Stand or sit with your arm, hand, and all five fingers pointed straight up. Make sure your wrist is straight. 2. Gently and slowly bend your fingers down and inward until the tips of your fingers are touching the tops of your palm. 3. Hold this position for a few seconds. 4. Extend your fingers out to their original position, all pointing straight up again. Finger Fan  Repeat this exercise 5-10 times with each hand. 1. Hold your arm and hand out in front of you. Keep your wrist straight. 2. Squeeze your hand into a fist. 3. Hold this position for a few seconds. 4. Fan out, or spread apart, your hand and fingers as much as possible, stretching every joint fully. Tabletop  Repeat this exercise 5-10 times with each hand. 1. Stand or sit with your arm, hand, and all five fingers pointed straight up. Make sure your wrist is straight. 2. Gently and slowly bend your fingers at the knuckles where they meet the hand until your hand is  making an upside-down L shape. Your fingers should form a tabletop. 3. Hold this position for a few seconds. 4. Extend your fingers out to their original position, all pointing straight up again. Making Os  Repeat this exercise 5-10 times with each hand. 1. Stand or sit with your arm, hand, and all five fingers pointed straight up. Make sure your wrist is straight. 2. Make an O shape by touching your pointer finger to your thumb. Hold for a few seconds. Then open your hand wide. 3. Repeat this motion with each finger on your hand. Table Spread  Repeat this exercise 5-10 times with each hand. 1. Place your hand on a table with your palm facing down. Make sure your wrist is straight. 2. Spread your fingers out as much as possible. Hold this position for a few seconds. 3. Slide your fingers back together again. Hold for a few seconds. Ball Grip  Repeat this exercise 10-15 times with each hand. 1. Hold a tennis ball or another soft ball in your hand. 2. While slowly increasing pressure, squeeze the ball as hard as possible. 3. Squeeze as hard as you can for 3-5 seconds. 4. Relax and repeat. Wrist Curls  Repeat this exercise 10-15 times with each hand. 1. Sit in a chair that has armrests. 2. Hold a light weight in your hand, such as a dumbbell that weighs 1-3 pounds (0.5-1.4 kg). Ask your health care provider what weight would be best for you. 3. Rest your hand just   over the end of the chair arm with your palm facing up. 4. Gently pivot your wrist up and down while holding the weight. Do not twist your wrist from side to side. Contact a health care provider if:  Your hand pain or discomfort gets much worse when you do an exercise.  Your hand pain or discomfort does not improve within 2 hours after you exercise. If you have any of these problems, stop doing these exercises right away. Do not do them again unless your health care provider says that you can. Get help right away if:  You  develop sudden, severe hand pain. If this happens, stop doing these exercises right away. Do not do them again unless your health care provider says that you can. This information is not intended to replace advice given to you by your health care provider. Make sure you discuss any questions you have with your health care provider. Document Released: 02/23/2015 Document Revised: 08/20/2015 Document Reviewed: 09/22/2014  2017 Elsevier  

## 2016-04-06 NOTE — Progress Notes (Signed)
Office Visit Note  Patient: Ricky Wilson             Date of Birth: May 23, 1955           MRN: [id number]             PCP: Glenda Chroman, MD Referring: Glenda Chroman, MD Visit Date: 04/06/2016 Occupation: _0 @    Subjective:  Pain of the Right Hand and Pain of the Left Hand   History of Present Illness: Ricky Wilson is a 61 y.o. male  Last seen 10/06/2015 Doing well with his gout. No flare.  Complaining of hives for the last 6-8 weeks, patient is having hives off and on. Having rash around the waist which then moves to other places of his body. Has a also lip swelling at times. This rash comes and goes. No shortness of breath. Cannot find the source of what is causing his highs.  Complaining of OA of the hands. Has occasional pain.  Using uloic as prescribed for his gout.      Activities of Daily Living:  Patient reports morning stiffness for 15 minutes.   Patient Denies nocturnal pain.  Difficulty dressing/grooming: Denies Difficulty climbing stairs: Denies Difficulty getting out of chair: Denies Difficulty using hands for taps, buttons, cutlery, and/or writing: Reports   Review of Systems  Constitutional: Negative for fatigue.  HENT: Negative for mouth sores and mouth dryness.   Eyes: Negative for dryness.  Respiratory: Negative for shortness of breath.   Gastrointestinal: Negative for constipation and diarrhea.  Musculoskeletal: Negative for myalgias and myalgias.  Skin: Positive for rash (hives off and on). Negative for sensitivity to sunlight.  Neurological: Negative for memory loss.  Psychiatric/Behavioral: Negative for sleep disturbance.    PMFS History:  Patient Active Problem List   Diagnosis Date Noted  . Essential hypertension, benign 04/08/2009  . Mixed hyperlipidemia 12/17/2008  . CAD, NATIVE VESSEL 12/17/2008    Past Medical History:  Diagnosis Date  . Arthritis   . Carpal tunnel syndrome, bilateral   . Coronary  atherosclerosis of native coronary artery    Occluded RCA with collaterals 2004  . Hypertension   . Mixed hyperlipidemia     Family History  Problem Relation Age of Onset  . Coronary artery disease     Past Surgical History:  Procedure Laterality Date  . CARPAL TUNNEL RELEASE  03/31/2011   Procedure: CARPAL TUNNEL RELEASE;  Surgeon: Cammie Sickle., MD;  Location: [place];  Service: Orthopedics;  Laterality: Left;  . CARPAL TUNNEL RELEASE  05/17/2011   Procedure: CARPAL TUNNEL RELEASE;  Surgeon: Cammie Sickle., MD;  Location: [place];  Service: Orthopedics;  Laterality: Right;  . ELBOW SURGERY  2008   right  . TORN LEFT MEDIAL MENISCUS  2008  . [place] History   Social History Narrative  . No narrative on file     Objective: Vital Signs: BP (!) 142/70   Pulse 66   Resp 14   Ht _1  (1.803 m)   Wt 224 lb (101.6 kg)   BMI 31.24 kg/m    Physical Exam  Constitutional: He is oriented to person, place, and time. He appears well-developed and well-nourished.  HENT:  Head: Normocephalic and atraumatic.  Eyes: Conjunctivae and EOM are normal. Pupils are equal, round, and reactive to light.  Neck: Normal range of motion. Neck supple.  Cardiovascular: Normal rate, regular rhythm and normal  heart sounds.  Exam reveals no gallop and no friction rub.   No murmur heard. Pulmonary/Chest: Effort normal and breath sounds normal. No respiratory distress. He has no wheezes. He has no rales. He exhibits no tenderness.  Abdominal: Soft. He exhibits no distension and no mass. There is no tenderness. There is no guarding.  Musculoskeletal: Normal range of motion.  Lymphadenopathy:    He has no cervical adenopathy.  Neurological: He is alert and oriented to person, place, and time. He exhibits normal muscle tone. Coordination normal.  Skin: Skin is warm and dry. Capillary refill takes less than 2 seconds. No rash noted.    Psychiatric: He has a normal mood and affect. His behavior is normal. Judgment and thought content normal.  Nursing note and vitals reviewed.    Musculoskeletal Exam:  Full range of motion of all joints   CDAI Exam: CDAI Homunculus Exam:   Joint Counts:  CDAI Tender Joint count: 0 CDAI Swollen Joint count: 0  Global Assessments:  Patient Global Assessment: 1 Provider Global Assessment: 1  CDAI Calculated Score: 2    Investigation: No additional findings.   Imaging: No results found.  Speciality Comments: No specialty comments available.    Procedures:  No procedures performed Allergies: Hydrocodone   Assessment / Plan:     Visit Diagnoses: Other secondary chronic gout of multiple sites without tophus - Plan: CMP14+EGFR, CBC with Differential, Uric acid, CMP14+EGFR, CBC with Differential, Uric acid  Primary osteoarthritis of both hands  Osteoarthritis of ankle and foot, unspecified laterality  Hives of unknown origin - Plan: CMP14+EGFR, CBC with Differential, Uric acid, CMP14+EGFR, CBC with Differential, Uric acid   Plan: #1: Gout. Chronic. Doing well. No flare. Using Uloric as prescribed. Has enough for now and does not need to refill  #2: Having hives for the last 6-8 weeks. Generally presents around the waist area. Then travels to other areas of the body. Has been happening for the last 6-8 weeks. Has not had a chance to speak with his PCP yet. Cannot find out the source of his hives. We discussed strategies to do that. In the meanwhile patient is getting partial benefit from Benadryl but has to take less of it because he feels very drowsy after taking the medications. I've given him the option of Zantac, Zyrtec, Allegra, Claritin as over-the-counter possible options. He will use one of these. I've given him a prescription for hydroxyzine 25 mg 1 by mouth 3 times a day when necessary, dispense 90 pills with no refills. He can use these if he needs something  stronger and if the other medications don't work for him. He already knows that Benadryl works but because it makes him drowsy cannot use that.  #3: History of OA of the hands. Discussed and exercises with the patient  #4: OA of the feet. Ongoing mild discomfort at times.  #5: Needs refill on Voltaren gel. I'll give him 3 tubes with 3 refills  #6: CBC with differential, CMP with GFR, uric acid, at lab core in 4 months since patient lives near [facility].  #7: Return to clinic in 5 months  Orders: Orders Placed This Encounter  Procedures  . CMP14+EGFR  . CBC with Differential  . Uric acid   Meds ordered this encounter  Medications  . diclofenac sodium (VOLTAREN) 1 % GEL    Sig: Voltaren Gel 3 grams to 3 large joints upto TID 3 TUBES with 3 refills    Dispense:  3 Tube  Refill:  3    Voltaren Gel 3 grams to 3 large joints upto TID 3 TUBES with 3 refills    Order Specific Question:   Supervising Provider    Answer:   Bo Merino [[id number]]  . hydrOXYzine (VISTARIL) 25 MG capsule    Sig: Take 1 capsule (25 mg total) by mouth 3 (three) times daily as needed for itching.    Dispense:  90 capsule    Refill:  0    Order Specific Question:   Supervising Provider    Answer:   Bo Merino [phone]    Face-to-face time spent with patient was 30 minutes. 50% of time was spent in counseling and coordination of care.  Follow-Up Instructions: Return in about 5 months (around 09/04/2016) for gout, hives, oa hands and feet.   Eliezer Lofts, PA-C I examined and evaluated the patient with Eliezer Lofts PA. The plan of care was discussed as noted above.  Bo Merino, MD Note - This record has been created using Editor, commissioning.  Chart creation errors have been sought, but may not always  have been located. Such creation errors do not reflect on  the standard of medical care.

## 2016-05-26 ENCOUNTER — Other Ambulatory Visit: Payer: Self-pay | Admitting: Rheumatology

## 2016-05-26 NOTE — Telephone Encounter (Signed)
Last Visit: 04/06/16 Next Visit: 09/05/16 Labs: 03/09/16 Stable  Okay to refill Uloric?

## 2016-07-11 ENCOUNTER — Ambulatory Visit (INDEPENDENT_AMBULATORY_CARE_PROVIDER_SITE_OTHER): Payer: 59 | Admitting: Cardiology

## 2016-07-11 ENCOUNTER — Encounter: Payer: Self-pay | Admitting: Cardiology

## 2016-07-11 VITALS — BP 138/78 | HR 56 | Ht 71.0 in | Wt 221.6 lb

## 2016-07-11 DIAGNOSIS — I10 Essential (primary) hypertension: Secondary | ICD-10-CM

## 2016-07-11 DIAGNOSIS — R7989 Other specified abnormal findings of blood chemistry: Secondary | ICD-10-CM

## 2016-07-11 DIAGNOSIS — I25119 Atherosclerotic heart disease of native coronary artery with unspecified angina pectoris: Secondary | ICD-10-CM

## 2016-07-11 DIAGNOSIS — E782 Mixed hyperlipidemia: Secondary | ICD-10-CM | POA: Diagnosis not present

## 2016-07-11 DIAGNOSIS — R945 Abnormal results of liver function studies: Secondary | ICD-10-CM

## 2016-07-11 NOTE — Progress Notes (Signed)
Cardiology Office Note  Date: 07/11/2016   ID: Ricky Wilson, [place] May 27, 1955, MRN [id number]  PCP: Glenda Chroman, MD  Primary Cardiologist: Rozann Lesches, MD   Chief Complaint  Patient presents with  . Coronary Artery Disease    History of Present Illness: Ricky Wilson is a 61 y.o. male last seen in October 2017. He presents for a routine follow-up visit. Reports no progressive angina symptoms, has very mild angina only at the highest levels of activity, and this is typically very brief. He has not had to use nitroglycerin. Still enjoying retirement, stays busy with chores. He and his wife have both been walking 2 miles a day, cutting back carbohydrates. He is trying to lose some weight.  Follow-up Myoview from October of last year as outlined below, low risk. I discussed the results with him again today.  When over his medications. Cardiac regimen includes aspirin, Lotensin, Toprol-XL, and omega-3 supplements. He has been off simvastatin since last summer per Dr. Woody Seller due to mildly abnormal LFTs. Not clear that this has been repeated as yet, he does have blood work with physical pending in the near future.  Past Medical History:  Diagnosis Date  . Arthritis   . Carpal tunnel syndrome, bilateral   . Coronary atherosclerosis of native coronary artery    Occluded RCA with collaterals 2004  . Hypertension   . Mixed hyperlipidemia     Past Surgical History:  Procedure Laterality Date  . CARPAL TUNNEL RELEASE  03/31/2011   Procedure: CARPAL TUNNEL RELEASE;  Surgeon: Cammie Sickle., MD;  Location: [facility];  Service: Orthopedics;  Laterality: Left;  . CARPAL TUNNEL RELEASE  05/17/2011   Procedure: CARPAL TUNNEL RELEASE;  Surgeon: Cammie Sickle., MD;  Location: [facility];  Service: Orthopedics;  Laterality: Right;  . ELBOW SURGERY  2008   right  . TORN LEFT MEDIAL MENISCUS  2008  . VASECTOMY  1977    Current Outpatient  Prescriptions  Medication Sig Dispense Refill  . aspirin EC 81 MG tablet Take 81 mg by mouth daily.    . benazepril (LOTENSIN) 10 MG tablet Take 1 tablet (10 mg total) by mouth every evening. 90 tablet 3  . diclofenac sodium (VOLTAREN) 1 % GEL Voltaren Gel 3 grams to 3 large joints upto TID 3 TUBES with 3 refills 3 Tube 3  . Ginger, Zingiber officinalis, (GINGER ROOT PO) Take by mouth.    . hydrOXYzine (VISTARIL) 25 MG capsule Take 1 capsule (25 mg total) by mouth 3 (three) times daily as needed for itching. 90 capsule 0  . metoprolol succinate (TOPROL-XL) 100 MG 24 hr tablet Take one-half by mouth daily. Take with or immediately following a meal. 45 tablet 3  . Misc Natural Products (TART CHERRY ADVANCED PO) Take by mouth.    . Multiple Vitamin (MULTIVITAMIN) tablet Take 1 tablet by mouth daily.      . Omega-3 Fatty Acids (FISH OIL) 1200 MG CAPS Take 1 capsule by mouth daily.      . Turmeric 500 MG TABS Take by mouth.    Marland Kitchen ULORIC 80 MG TABS TAKE 1 TABLET EVERY DAY 90 tablet 1  . simvastatin (ZOCOR) 20 MG tablet TAKE 1 TABLET (20 MG TOTAL) BY MOUTH DAILY. (Patient not taking: Reported on 04/06/2016) 30 tablet 6   No current facility-administered medications for this visit.    Allergies:  Hydrocodone   Social History: The patient  reports that  he has never smoked. He has never used smokeless tobacco. He reports that he drinks alcohol. He reports that he does not use drugs.   ROS:  Please see the history of present illness. Otherwise, complete review of systems is positive for arthritic symptoms, stable.  All other systems are reviewed and negative.   Physical Exam: VS:  BP 138/78   Pulse (!) 56   Ht 5\' 11"  (1.803 m)   Wt 221 lb 9.6 oz (100.5 kg)   SpO2 97%   BMI 30.91 kg/m , BMI Body mass index is 30.91 kg/m.  Wt Readings from Last 3 Encounters:  07/11/16 221 lb 9.6 oz (100.5 kg)  04/06/16 224 lb (101.6 kg)  01/13/16 220 lb (99.8 kg)    Normally nourished appearing male in no  acute distress.  HEENT: Conjunctiva and lids normal, oropharynx clear.  Neck: Supple, no elevated jugular venous pressure, no bruits.  Lungs: Clear to auscultation, nonlabored.  Cardiac: Regular rate and rhythm, no S3, no significant murmur.  Abdomen: Soft, nontender, no hepatomegaly, no bruits.  Skin: Warm and dry.  Extremities: No pitting edema, distal pulses 2+. Arthritic changes noted in his hands. musculoskeletal: No kyphosis. Neuropsychiatric: Alert and oriented 3, affect appropriate.  ECG: I personally reviewed the tracing from 01/13/2016 which showed normal sinus rhythm.  Recent Labwork: 03/09/2016: ALT 58; AST 35; BUN 12; Creatinine, Ser 1.06; Platelets 141; Potassium 4.7; Sodium 140     Component Value Date/Time   CHOL 119 05/25/2006 0734   TRIG 159 (H) 05/25/2006 0734   HDL 36.9 (L) 05/25/2006 0734   CHOLHDL 3.2 CALC 05/25/2006 0734   VLDL 32 05/25/2006 0734   LDLCALC 50 05/25/2006 0734    Other Studies Reviewed Today:  Lexiscan Myoview 01/21/2016:  No diagnostic ST segment changes to indicate ischemia.  No significant myocardial perfusion defects to indicate scar or ischemia.  This is a low risk study.  Nuclear stress EF: 58%.  Assessment and Plan:  1. CAD with stable mild angina symptoms, known occlusion of the RCA associate with collaterals. Follow-up Myoview from last October was low risk. Plan to continue medical therapy and observation.  2. History of hyperlipidemia, simvastatin has been on hold per Dr. Woody Seller due to mildly abnormal LFTs. He needs to have repeat studies obtained. If normal, would consider switching to a different statin such as Crestor at low-dose. If LFTs remain abnormal, would refer to GI for further workup.  3. Essential hypertension, blood pressure is adequately controlled today, systolic in the 701I.  4. Osteoarthritis and gout. He is following now with rheumatology.  Current medicines were reviewed with the patient  today.  Disposition: Follow-up in 6 months.  Signed, Satira Sark, MD, [facility] 07/11/2016 8:23 AM    [place] [facility] [place], [place], [place] [address] Phone: [phone]; Fax: [phone]

## 2016-07-11 NOTE — Patient Instructions (Signed)

## 2016-07-12 ENCOUNTER — Other Ambulatory Visit: Payer: Self-pay | Admitting: Cardiology

## 2016-07-12 MED ORDER — BENAZEPRIL HCL 10 MG PO TABS: 10.0000 mg | ORAL_TABLET | Freq: Every evening | ORAL | 3 refills | Status: DC

## 2016-07-12 NOTE — Addendum Note (Signed)
Addended by: Levonne Hubert on: 07/12/2016 09:34 AM   Modules accepted: Orders

## 2016-09-05 ENCOUNTER — Encounter: Payer: Self-pay | Admitting: Rheumatology

## 2016-09-05 ENCOUNTER — Ambulatory Visit (INDEPENDENT_AMBULATORY_CARE_PROVIDER_SITE_OTHER): Payer: 59 | Admitting: Rheumatology

## 2016-09-05 VITALS — BP 148/72 | HR 64 | Resp 14 | Ht 71.0 in | Wt 214.0 lb

## 2016-09-05 DIAGNOSIS — M19041 Primary osteoarthritis, right hand: Secondary | ICD-10-CM

## 2016-09-05 DIAGNOSIS — M1A49X0 Other secondary chronic gout, multiple sites, without tophus (tophi): Secondary | ICD-10-CM

## 2016-09-05 DIAGNOSIS — M19079 Primary osteoarthritis, unspecified ankle and foot: Secondary | ICD-10-CM | POA: Diagnosis not present

## 2016-09-05 DIAGNOSIS — M19042 Primary osteoarthritis, left hand: Secondary | ICD-10-CM | POA: Diagnosis not present

## 2016-09-05 MED ORDER — FEBUXOSTAT 80 MG PO TABS: 1.0000 | ORAL_TABLET | Freq: Every day | ORAL | 1 refills | Status: DC

## 2016-09-05 NOTE — Progress Notes (Signed)
Office Visit Note  Patient: Ricky Wilson             Date of Birth: [birth year]           MRN: [id number]             PCP: Glenda Chroman, MD Referring: Glenda Chroman, MD Visit Date: 09/05/2016 Occupation: @GUAROCC @    Subjective:  No chief complaint on file.   History of Present Illness: Ricky Wilson is a 61 y.o. male  Was last seen in our office [place] 01/14/2017.  Patient is been doing well with his gout since that visit. He's been taking his medication, staying hydrated, eating proper diet to avoid gout flare. He has not had any flare .  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 Unfortunately despite drinking adequate amount of water/Gatorade, he did become dehydrated. According to the patient, Dr. Woody Seller, his PCP, patient felt light headed and blood work showed that he had acute renal failure due to dehydration due to working outdoors Monday through Friday and having inadequate water intake. Patient was admitted to the hospital and had IV fluids. Patient is doing fine after that.  Patient was supposed to have labs done prior to our office visit. He states that he did get them done but they haven't been sent to our office and we will get them for the patient.    Activities of Daily Living:  Patient reports morning stiffness for 5 minutes.   Patient Denies nocturnal pain.  Difficulty dressing/grooming: Denies Difficulty climbing stairs: Denies Difficulty getting out of chair: Denies Difficulty using hands for taps, buttons, cutlery, and/or writing: Denies   No Rheumatology ROS completed.   PMFS History:  Patient Active Problem List   Diagnosis Date Noted  . Essential hypertension, benign 04/08/2009  . Mixed hyperlipidemia 12/17/2008  . CAD, NATIVE VESSEL 12/17/2008    Past Medical History:  Diagnosis Date  . Arthritis   . Carpal tunnel syndrome, bilateral   . Coronary atherosclerosis of native coronary  artery    Occluded RCA with collaterals 2004  . Hypertension   . Mixed hyperlipidemia     Family History  Problem Relation Age of Onset  . Coronary artery disease Unknown    Past Surgical History:  Procedure Laterality Date  . CARPAL TUNNEL RELEASE  03/31/2011   Procedure: CARPAL TUNNEL RELEASE;  Surgeon: Cammie Sickle., MD;  Location: [facility];  Service: Orthopedics;  Laterality: Left;  . CARPAL TUNNEL RELEASE  05/17/2011   Procedure: CARPAL TUNNEL RELEASE;  Surgeon: Cammie Sickle., MD;  Location: [facility];  Service: Orthopedics;  Laterality: Right;  . ELBOW SURGERY  2008   right  . TORN LEFT MEDIAL MENISCUS  2008  . [place] History   Social History Narrative  . No narrative on file     Objective: Vital Signs: BP (!) 148/72   Pulse 64   Resp 14   Ht 5\' 11"  (1.803 m)   Wt 214 lb (97.1 kg)   BMI 29.85 kg/m    Physical Exam   Musculoskeletal Exam:  Full range of motion of all joints Grip strength is equal and strong bilaterally Fiber myalgia tender points are all absent  CDAI Exam: No CDAI exam completed.  No synovitis on examination. Patient has a history of DIP PIP prominence bilaterally  Investigation: No additional findings. Orders Only  on 03/09/2016  Component Date Value Ref Range Status  . WBC 03/09/2016 4.5  3.4 - 10.8 x10E3/uL Final  . RBC 03/09/2016 4.80  4.14 - 5.80 x10E6/uL Final  . Hemoglobin 03/09/2016 14.8  13.0 - 17.7 g/dL Final                 **Please note reference interval change**  . Hematocrit 03/09/2016 41.6  37.5 - 51.0 % Final  . MCV 03/09/2016 87  79 - 97 fL Final  . MCH 03/09/2016 30.8  26.6 - 33.0 pg Final  . MCHC 03/09/2016 35.6  31.5 - 35.7 g/dL Final  . RDW 03/09/2016 13.3  12.3 - 15.4 % Final  . Platelets 03/09/2016 141* 150 - 379 x10E3/uL Final  . Neutrophils 03/09/2016 44  Not Estab. % Final  . Lymphs 03/09/2016 40  Not Estab. % Final  . Monocytes 03/09/2016 10  Not  Estab. % Final  . Eos 03/09/2016 5  Not Estab. % Final  . Basos 03/09/2016 1  Not Estab. % Final  . Neutrophils Absolute 03/09/2016 2.0  1.4 - 7.0 x10E3/uL Final  . Lymphocytes Absolute 03/09/2016 1.8  0.7 - 3.1 x10E3/uL Final  . Monocytes Absolute 03/09/2016 0.4  0.1 - 0.9 x10E3/uL Final  . EOS (ABSOLUTE) 03/09/2016 0.2  0.0 - 0.4 x10E3/uL Final  . Basophils Absolute 03/09/2016 0.0  0.0 - 0.2 x10E3/uL Final  . Immature Granulocytes 03/09/2016 0  Not Estab. % Final  . Immature Grans (Abs) 03/09/2016 0.0  0.0 - 0.1 x10E3/uL Final  . Glucose 03/09/2016 100* 65 - 99 mg/dL Final  . BUN 03/09/2016 12  8 - 27 mg/dL Final  . Creatinine, Ser 03/09/2016 1.06  0.76 - 1.27 mg/dL Final  . GFR calc non Af Amer 03/09/2016 75  >59 mL/min/1.73 Final  . GFR calc Af Amer 03/09/2016 87  >59 mL/min/1.73 Final  . BUN/Creatinine Ratio 03/09/2016 11  10 - 24 Final  . Sodium 03/09/2016 140  134 - 144 mmol/L Final  . Potassium 03/09/2016 4.7  3.5 - 5.2 mmol/L Final  . Chloride 03/09/2016 101  96 - 106 mmol/L Final  . CO2 03/09/2016 25  18 - 29 mmol/L Final  . Calcium 03/09/2016 10.5* 8.6 - 10.2 mg/dL Final  . Total Protein 03/09/2016 6.3  6.0 - 8.5 g/dL Final  . Albumin 03/09/2016 4.5  3.6 - 4.8 g/dL Final  . Globulin, Total 03/09/2016 1.8  1.5 - 4.5 g/dL Final  . Albumin/Globulin Ratio 03/09/2016 2.5* 1.2 - 2.2 Final  . Bilirubin Total 03/09/2016 0.5  0.0 - 1.2 mg/dL Final  . Alkaline Phosphatase 03/09/2016 84  39 - 117 IU/L Final  . AST 03/09/2016 35  0 - 40 IU/L Final  . ALT 03/09/2016 58* 0 - 44 IU/L Final  . Uric Acid 03/09/2016 5.2  3.7 - 8.6 mg/dL Final              Therapeutic target for gout patients: <6.0     Imaging: No results found.  Speciality Comments: No specialty comments available.    Procedures:  No procedures performed Allergies: Hydrocodone   Assessment / Plan:     Visit Diagnoses: Other secondary chronic gout of multiple sites without tophus  Primary osteoarthritis of  both hands  Osteoarthritis of ankle and foot, unspecified laterality     #1: Gout. Chronic. Doing well. No flare. Using Uloric as prescribed.  Has enough for now and does not need to refill Patient's uric acid on December 2017 labs  was 5.2 (desirable range).   #2:  History of OA of the hands. Discussed and exercises with the patient  #3 OA of the feet. Ongoing mild discomfort at times.  #4: CBC with differential, CMP with GFR, uric acid, at lab core in 4 months since patient lives near [facility]. (I do not see a copy of his labs from April or May in the chart so we will do these labs in our office today.) Patient states that he did it at his PCPs office. Ms. Acquanetta Belling was able to call Dr. Marcial Pacas office and asked him to forward his last labs were office. Note: Patient states that his labs were within normal limits when he had them done including his uric acid.  #5: Return to clinic in 6 months  #6: Refill Uloric  #7: Dehydration last week that cause acute renal failure according to patient. He was hospitalized and had IV fluids. 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 He is doing well today.  Orders: No orders of the defined types were placed in this encounter.  No orders of the defined types were placed in this encounter.   Face-to-face time spent with patient was 30 minutes. 50% of time was spent in counseling and coordination of care.  Follow-Up Instructions: Return in about 6 months (around 03/07/2017) for gout,oahands,dehydration,acute renal failure.   Eliezer Lofts, PA-C  Note - This record has been created using [employer].  Chart creation errors have been sought, but may not always  have been located. Such creation errors do not reflect on  the standard of medical care.

## 2016-09-12 ENCOUNTER — Telehealth: Payer: Self-pay | Admitting: *Deleted

## 2016-09-12 NOTE — Telephone Encounter (Signed)
Labs received from PCP drawn on 08/25/16. Reviewed by Mr. Carlyon Shadow  CBC, CMP and Uric Acid WNL

## 2016-10-06 ENCOUNTER — Encounter: Payer: Self-pay | Admitting: Cardiology

## 2016-10-10 ENCOUNTER — Telehealth: Payer: Self-pay | Admitting: *Deleted

## 2016-10-10 NOTE — Telephone Encounter (Signed)
Lipid level received and reviewed by Domenic Polite. Per patient he confirmed that he is taking 1200 mg fish oil daily and was placed on crestor 10 mg daily by doctor Vyas 3 days ago.

## 2016-10-11 MED ORDER — FISH OIL 1000 MG PO CAPS: 1000.0000 mg | ORAL_CAPSULE | Freq: Two times a day (BID) | ORAL | 0 refills | Status: AC

## 2016-10-11 NOTE — Addendum Note (Signed)
Addended by: Merlene Laughter on: 10/11/2016 11:24 AM   Modules accepted: Orders

## 2016-10-11 NOTE — Telephone Encounter (Signed)
Patient informed and verbalized understanding of plan. Patient said that he will have repeat lipid labs in 12 weeks with Dr. Woody Seller.

## 2016-10-11 NOTE — Telephone Encounter (Signed)
Noted. Would consider getting omega-3 1000 mg tablets and taking two a day with eventual goal to uptitrateto four a day if triglycerides do not come down. The addition of Crestor may not bring down triglycerides very much - he should have followup labs with Dr. Woody Seller.

## 2016-12-07 ENCOUNTER — Other Ambulatory Visit: Payer: Self-pay | Admitting: Cardiology

## 2017-01-13 ENCOUNTER — Encounter: Payer: Self-pay | Admitting: *Deleted

## 2017-01-16 ENCOUNTER — Ambulatory Visit (INDEPENDENT_AMBULATORY_CARE_PROVIDER_SITE_OTHER): Payer: 59 | Admitting: Cardiology

## 2017-01-16 ENCOUNTER — Encounter: Payer: Self-pay | Admitting: *Deleted

## 2017-01-16 VITALS — BP 138/72 | HR 56 | Ht 71.0 in | Wt 224.4 lb

## 2017-01-16 DIAGNOSIS — N529 Male erectile dysfunction, unspecified: Secondary | ICD-10-CM

## 2017-01-16 DIAGNOSIS — E782 Mixed hyperlipidemia: Secondary | ICD-10-CM

## 2017-01-16 DIAGNOSIS — I25119 Atherosclerotic heart disease of native coronary artery with unspecified angina pectoris: Secondary | ICD-10-CM

## 2017-01-16 DIAGNOSIS — I10 Essential (primary) hypertension: Secondary | ICD-10-CM | POA: Diagnosis not present

## 2017-01-16 MED ORDER — METOPROLOL SUCCINATE ER 25 MG PO TB24: 25.0000 mg | ORAL_TABLET | Freq: Every day | ORAL | 6 refills | Status: DC

## 2017-01-16 NOTE — Patient Instructions (Addendum)
Medication Instructions:  Your physician has recommended you make the following change in your medication:   Decrease metoprolol to 25 mg daily  Please continue all other medications as prescribed  Labwork: NONE  Testing/Procedures: NONE  Follow-Up: Your physician wants you to follow-up in: [place]. You will receive a reminder letter in the mail two months in advance. If you don't receive a letter, please call our office to schedule the follow-up appointment.  Any Other Special Instructions Will Be Listed Below (If Applicable).  If you need a refill on your cardiac medications before your next appointment, please call your pharmacy.

## 2017-01-16 NOTE — Progress Notes (Signed)
Cardiology Office Note  Date: 01/16/2017   ID: [facility] [place] [facility], [place] 07-Jul-1955, MRN [id number]  PCP: Glenda Chroman, MD  Primary Cardiologist: Rozann Lesches, MD   Chief Complaint  Patient presents with  . Coronary Artery Disease    History of Present Illness: Geofrey Silliman is a 61 y.o. male last seen in April. He presents today for a routine follow-up visit. He does not report any progressive angina symptoms. States that he paces himself when working outdoors. He has not required any nitroglycerin. Reports NYHA class II dyspnea. No palpitations or syncope.  Follow-up Myoview in October of last year was low risk as outlined below. We discussed the results today.  Current cardiac regimen is reviewed below. We discussed reducing his Toprol-XL dose to 25 mg daily. He does report some erectile dysfunction. Heart rate in the high 50s today.  I personally reviewed his ECG today which shows sinus bradycardia.  He had recent lab work per Dr. Woody Seller as outlined below.  Past Medical History:  Diagnosis Date  . Arthritis   . Carpal tunnel syndrome, bilateral   . Coronary atherosclerosis of native coronary artery    Occluded RCA with collaterals 2004  . Hypertension   . Mixed hyperlipidemia     Past Surgical History:  Procedure Laterality Date  . CARPAL TUNNEL RELEASE  03/31/2011   Procedure: CARPAL TUNNEL RELEASE;  Surgeon: Cammie Sickle., MD;  Location: [facility];  Service: Orthopedics;  Laterality: Left;  . CARPAL TUNNEL RELEASE  05/17/2011   Procedure: CARPAL TUNNEL RELEASE;  Surgeon: Cammie Sickle., MD;  Location: [facility];  Service: Orthopedics;  Laterality: Right;  . ELBOW SURGERY  2008   right  . TORN LEFT MEDIAL MENISCUS  2008  . VASECTOMY  1977    Current Outpatient Prescriptions  Medication Sig Dispense Refill  . aspirin EC 81 MG tablet Take 81 mg by mouth daily.    . benazepril (LOTENSIN) 10 MG tablet Take 1 tablet  (10 mg total) by mouth every evening. 90 tablet 3  . diclofenac sodium (VOLTAREN) 1 % GEL Voltaren Gel 3 grams to 3 large joints upto TID 3 TUBES with 3 refills 3 Tube 3  . Febuxostat (ULORIC) 80 MG TABS Take 1 tablet (80 mg total) by mouth daily. 90 tablet 1  . Ginger, Zingiber officinalis, (GINGER ROOT PO) Take by mouth.    . Misc Natural Products (TART CHERRY ADVANCED PO) Take by mouth.    . Multiple Vitamin (MULTIVITAMIN) tablet Take 1 tablet by mouth daily.      . Omega-3 Fatty Acids (FISH OIL) 1000 MG CAPS Take 1 capsule (1,000 mg total) by mouth 2 (two) times daily. 60 each 0  . ranitidine (ZANTAC) 150 MG tablet     . rosuvastatin (CRESTOR) 10 MG tablet Take 10 mg by mouth daily.    . Turmeric 500 MG TABS Take by mouth.    . metoprolol succinate (TOPROL XL) 25 MG 24 hr tablet Take 1 tablet (25 mg total) by mouth daily. 30 tablet 6   No current facility-administered medications for this visit.    Allergies:  Hydrocodone   Social History: The patient  reports that he has never smoked. He has never used smokeless tobacco. He reports that he drinks alcohol. He reports that he does not use drugs.   ROS:  Please see the history of present illness. Otherwise, complete review of systems is positive for none.  All other systems are reviewed and negative.   Physical Exam: VS:  BP 138/72   Pulse (!) 56   Ht 5\' 11"  (1.803 m)   Wt 224 lb 6.4 oz (101.8 kg)   BMI 31.30 kg/m , BMI Body mass index is 31.3 kg/m.  Wt Readings from Last 3 Encounters:  01/16/17 224 lb 6.4 oz (101.8 kg)  09/05/16 214 lb (97.1 kg)  07/11/16 221 lb 9.6 oz (100.5 kg)    General: Patient appears comfortable at rest. HEENT: Conjunctiva and lids normal, oropharynx clear. Neck: Supple, no elevated JVP or carotid bruits, no thyromegaly. Lungs: Clear to auscultation, nonlabored breathing at rest. Cardiac: Regular rate and rhythm, no S3 or significant systolic murmur. Abdomen: Soft, nontender, bowel sounds present, no  guarding or rebound. Extremities: No pitting edema, distal pulses 2+. Skin: Warm and dry. Musculoskeletal: No kyphosis. Neuropsychiatric: Alert and oriented x3, affect grossly appropriate.  ECG: I personally reviewed the tracing from 01/13/2016 which showed normal sinus rhythm.  Recent Labwork: 03/09/2016: ALT 58; AST 35; BUN 12; Creatinine, Ser 1.06; Hemoglobin 14.8; Platelets 141; Potassium 4.7; Sodium 140     Component Value Date/Time   CHOL 119 05/25/2006 0734   TRIG 159 (H) 05/25/2006 0734   HDL 36.9 (L) 05/25/2006 0734   CHOLHDL 3.2 CALC 05/25/2006 0734   VLDL 32 05/25/2006 0734   LDLCALC 50 05/25/2006 0734  October 2018: Cholesterol 132, triglycerides 213, HDL 36, LDL 53, AST 44, ALT 63  Other Studies Reviewed Today:  Lexiscan Myoview 01/21/2016:  No diagnostic ST segment changes to indicate ischemia.  No significant myocardial perfusion defects to indicate scar or ischemia.  This is a low risk study.  Nuclear stress EF: 58%.  Assessment and Plan:  1. Symptomatically stable CAD without progressive angina symptoms on medical therapy. He has known occlusion of the RCA associated with collaterals and had a follow-up low risk Myoview study 1 year ago. We will continue with observation for now. Reduce Toprol-XL to 25 mg daily.  2. Hyperlipidemia, switched from simvastatin to Crestor by Dr. Woody Seller. Recent lab work reviewed above. He has mild increase in AST and ALT that is relatively stable. Could be secondary to hepatic steatosis.  3. Essential hypertension, continue with medical therapy, diet and exercise.  4. Erectile dysfunction, reducing beta blocker dose.  Current medicines were reviewed with the patient today.   Orders Placed This Encounter  Procedures  . EKG 12-Lead    Disposition: Follow-up in 6 months.  Signed, Satira Sark, MD, [facility] 01/16/2017 11:26 AM    [place] at [place], [place], [place] [address] Phone:  [phone]; Fax: [phone]

## 2017-02-20 NOTE — Pre-Procedure Instructions (Signed)
[facility]  02/20/2017      [id number] - [place], [place] [address] [place] [address] Phone: [phone] Fax: [phone]    Your procedure is scheduled on Thursday, December 6.  Report to [facility] Admitting at 5:30 AM                  Your surgery or procedure is scheduled for 7:30 AM   Call this number if you have problems the morning of surgery:  [phone]- pre- op desk               For any other questions, please call [phone], Monday - Friday 8 AM - 4 PM. PAT desk, ask for any nurse    Remember:  Do not eat food or drink liquids after midnight.= Wednesday, December 5.  Take these medicines the morning of surgery with A SIP OF WATER : metoprolol succinate (TOPROL XL).  1 Week prior to surgery STOP taking Aspirin-unless instructed differently by your doctor, Aspirin Products (Goody Powder, Excedrin Migraine), Ibuprofen (Advil), Naproxen (Aleve), Vitamins and Herbal Products (ie Fish Oil)  Special Instructions:   [facility]- Preparing For Surgery  Before surgery, you can play an important role. Because skin is not sterile, your skin needs to be as free of germs as possible. You can reduce the number of germs on your skin by washing with CHG (chlorahexidine gluconate) Soap before surgery.  CHG is an antiseptic cleaner which kills germs and bonds with the skin to continue killing germs even after washing.  Please do not use if you have an allergy to CHG or antibacterial soaps. If your skin becomes reddened/irritated stop using the CHG.  Do not shave (including legs and underarms) for at least 48 hours prior to first CHG shower. It is OK to shave your face.  Please follow these instructions carefully.   1. Shower the NIGHT BEFORE SURGERY and the MORNING OF SURGERY with CHG.   2. If you chose to wash your hair, wash your hair first as usual with your normal shampoo.  3. After  you shampoo, rinse your hair and body thoroughly to remove the shampoo.      Wash your face and private area with the soap you use at home, then rinse.  4. Use CHG as you would any other liquid soap. You can apply CHG directly to the skin and wash gently with a scrungie or a clean washcloth.   5. Apply the CHG Soap to your body ONLY FROM THE NECK DOWN.  Do not use on open wounds or open sores. Avoid contact with your eyes, ears, mouth and genitals (private parts). Wash Face and genitals (private parts)  with your normal soap.  6. Wash thoroughly, paying special attention to the area where your surgery will be performed.  7. Thoroughly rinse your body with warm water from the neck down.  8. DO NOT shower/wash with your normal soap after using and rinsing off the CHG Soap.  9. Pat yourself dry with a CLEAN TOWEL.  10. Wear CLEAN PAJAMAS to bed the night before surgery, wear comfortable clothes the morning of surgery  11. Place CLEAN SHEETS on your bed the night of your first shower and DO NOT SLEEP WITH PETS.  Day of Surgery: Shower as above Do not apply any deodorants/lotions, powders or colognes. Please wear clean clothes to the hospital/surgery center.  Do not wear jewelry, make-up or nail polish.  Do not shave 48 hours prior to surgery.  Men may shave face and neck.  Do not bring valuables to the hospital.  [facility] is not responsible for any belongings or valuables.  Contacts, dentures or bridgework may not be worn into surgery.  Leave your suitcase in the car.  After surgery it may be brought to your room.  For patients admitted to the hospital, discharge time will be determined by your treatment team.  Patients discharged the day of surgery will not be allowed to drive home.   Please read over the following fact sheets that you were given.

## 2017-02-21 ENCOUNTER — Encounter (HOSPITAL_COMMUNITY): Payer: Self-pay

## 2017-02-21 ENCOUNTER — Other Ambulatory Visit: Payer: Self-pay

## 2017-02-21 ENCOUNTER — Encounter (HOSPITAL_COMMUNITY)
Admission: RE | Admit: 2017-02-21 | Discharge: 2017-02-21 | Disposition: A | Discharge status: Home / Self Care | Payer: 59 | Source: Ambulatory Visit | Attending: Orthopedic Surgery | Admitting: Orthopedic Surgery

## 2017-02-21 DIAGNOSIS — Z01818 Encounter for other preprocedural examination: Secondary | ICD-10-CM | POA: Insufficient documentation

## 2017-02-21 HISTORY — DX: Gout, unspecified: M10.9

## 2017-02-21 LAB — BASIC METABOLIC PANEL
ANION GAP: 6 (ref 5–15)
BUN: 9 mg/dL (ref 6–20)
CALCIUM: 11.3 mg/dL — AB (ref 8.9–10.3)
CO2: 26 mmol/L (ref 22–32)
Chloride: 106 mmol/L (ref 101–111)
Creatinine, Ser: 1.09 mg/dL (ref 0.61–1.24)
Glucose, Bld: 99 mg/dL (ref 65–99)
POTASSIUM: 4.3 mmol/L (ref 3.5–5.1)
Sodium: 138 mmol/L (ref 135–145)

## 2017-02-21 LAB — CBC
HCT: 43.5 % (ref 39.0–52.0)
HEMOGLOBIN: 15.1 g/dL (ref 13.0–17.0)
MCH: 30.8 pg (ref 26.0–34.0)
MCHC: 34.7 g/dL (ref 30.0–36.0)
MCV: 88.8 fL (ref 78.0–100.0)
Platelets: 155 10*3/uL (ref 150–400)
RBC: 4.9 MIL/uL (ref 4.22–5.81)
RDW: 12.4 % (ref 11.5–15.5)
WBC: 5.3 10*3/uL (ref 4.0–10.5)

## 2017-02-21 LAB — SURGICAL PCR SCREEN
MRSA, PCR: NEGATIVE
STAPHYLOCOCCUS AUREUS: NEGATIVE

## 2017-02-21 NOTE — Progress Notes (Signed)
PCP - Dr. Woody Seller Cardiologist - Dr. Domenic Polite  Chest x-ray - n/a EKG - 01/16/2017 Stress Test - 01/21/2016 ECHO - patient unsure, thinks it could have been around the same time as the cardiac cath. Cardiac Cath - 02/14/2003 at [facility]  Sleep Study - patient denies  Aspirin Instructions: Patient stopped asa 02/20/2017  Anesthesia review: yes, hx of CAD  Patient denies shortness of breath, fever, cough and chest pain at PAT appointment   Patient verbalized understanding of instructions that were given to them at the PAT appointment. Patient was also instructed that they will need to review over the PAT instructions again at home before surgery.

## 2017-02-22 NOTE — Progress Notes (Signed)
Anesthesia Chart Review:  Pt is a 61 year old male scheduled for left total shoulder arthroplasty on 03/02/2017 with Justice Britain, M.D.  - PCP is Jerene Bears, MD - Cardiologist is Rozann Lesches, MD. LAst office visit 01/16/17  PMH includes: CAD (occluded RCA with collaterals 2004), HTN, hyperlipidemia. Never smoker. BMI 31. S/p carpal tunnel release 05/17/11 and 03/31/11.   Medications include: ASA 81 mg benazepril, metoprolol, rosuvastatin. Stopped ASA 02/20/17  BP (!) 151/79   Pulse 76   Temp 36.8 C   Resp 20   Ht 5\' 11"  (1.803 m)   Wt 225 lb (102.1 kg)   SpO2 96%   BMI 31.38 kg/m   Preoperative labs reviewed.    EKG 01/16/17: Sinus bradycardia (55 bpm)  Nuclear stress test 01/21/16:   No diagnostic ST segment changes to indicate ischemia.  No significant myocardial perfusion defects to indicate scar or ischemia.  This is a low risk study.  Nuclear stress EF: 58%.  Cardiac cath 02/15/03 (labeled operative note in Epic): 1. LM: Large vessel, angiographically unremarkable 2. LAD: Proximal 25% stenosis. Diffuse disease throughout proximal to mid portion, but no occlusive disease or obstructive disease. 4 small diagonal branches have luminal irregularities 3. CX: Has a number of small OM was in a large posterior lateral branch. Bifurcates along the inferior lateral wall. 50% lesion in the midportion of OM 4. RCA: Large dominant vessel is occluded in its proximal portion. Fills 5 right to right and left to right collaterals. Moderate caliber posterior descending during coronary artery disease - Treated medically   If no changes, I anticipate pt can proceed with surgery as scheduled.   Willeen Cass, FNP-BC [facility]/Anesthesiology Phone: [phone] 02/22/2017 2:58 PM

## 2017-03-02 ENCOUNTER — Ambulatory Visit (HOSPITAL_COMMUNITY): Payer: 59 | Admitting: Emergency Medicine

## 2017-03-02 ENCOUNTER — Encounter (HOSPITAL_COMMUNITY): Payer: Self-pay

## 2017-03-02 ENCOUNTER — Ambulatory Visit (HOSPITAL_COMMUNITY): Payer: 59 | Admitting: Certified Registered Nurse Anesthetist

## 2017-03-02 ENCOUNTER — Ambulatory Visit (HOSPITAL_COMMUNITY)
Admission: RE | Admit: 2017-03-02 | Discharge: 2017-03-02 | Disposition: A | Discharge status: Home / Self Care | Payer: 59 | Source: Ambulatory Visit | Attending: Orthopedic Surgery | Admitting: Orthopedic Surgery

## 2017-03-02 ENCOUNTER — Other Ambulatory Visit: Payer: Self-pay

## 2017-03-02 ENCOUNTER — Encounter (HOSPITAL_COMMUNITY)
Admission: RE | Disposition: A | Discharge status: Home / Self Care | Payer: Self-pay | Source: Ambulatory Visit | Attending: Orthopedic Surgery

## 2017-03-02 DIAGNOSIS — I2510 Atherosclerotic heart disease of native coronary artery without angina pectoris: Secondary | ICD-10-CM | POA: Diagnosis not present

## 2017-03-02 DIAGNOSIS — M109 Gout, unspecified: Secondary | ICD-10-CM | POA: Diagnosis not present

## 2017-03-02 DIAGNOSIS — Z7982 Long term (current) use of aspirin: Secondary | ICD-10-CM | POA: Insufficient documentation

## 2017-03-02 DIAGNOSIS — M19012 Primary osteoarthritis, left shoulder: Secondary | ICD-10-CM | POA: Insufficient documentation

## 2017-03-02 DIAGNOSIS — E782 Mixed hyperlipidemia: Secondary | ICD-10-CM | POA: Diagnosis not present

## 2017-03-02 DIAGNOSIS — I10 Essential (primary) hypertension: Secondary | ICD-10-CM | POA: Diagnosis not present

## 2017-03-02 DIAGNOSIS — Z79899 Other long term (current) drug therapy: Secondary | ICD-10-CM | POA: Insufficient documentation

## 2017-03-02 HISTORY — PX: TOTAL SHOULDER ARTHROPLASTY: SHX126

## 2017-03-02 SURGERY — ARTHROPLASTY, SHOULDER, TOTAL
Anesthesia: Regional | Site: Shoulder | Laterality: Left

## 2017-03-02 MED ORDER — CEFAZOLIN SODIUM-DEXTROSE 2-4 GM/100ML-% IV SOLN
2.0000 g | INTRAVENOUS | Status: AC
  Administered 2017-03-02: 2 g via INTRAVENOUS

## 2017-03-02 MED ORDER — SUCCINYLCHOLINE CHLORIDE 200 MG/10ML IV SOSY
PREFILLED_SYRINGE | INTRAVENOUS | Status: AC
  Filled 2017-03-02: qty 10

## 2017-03-02 MED ORDER — EPHEDRINE SULFATE 50 MG/ML IJ SOLN
INTRAMUSCULAR | Status: DC | PRN
  Administered 2017-03-02 (×2): 5 mg via INTRAVENOUS

## 2017-03-02 MED ORDER — ROPIVACAINE HCL 7.5 MG/ML IJ SOLN
INTRAMUSCULAR | Status: DC | PRN
  Administered 2017-03-02: 20 mL via PERINEURAL

## 2017-03-02 MED ORDER — ONDANSETRON HCL 4 MG/2ML IJ SOLN
INTRAMUSCULAR | Status: AC
  Filled 2017-03-02: qty 2

## 2017-03-02 MED ORDER — DEXAMETHASONE SODIUM PHOSPHATE 10 MG/ML IJ SOLN
INTRAMUSCULAR | Status: DC | PRN
  Administered 2017-03-02: 5 mg via INTRAVENOUS

## 2017-03-02 MED ORDER — ONDANSETRON HCL 4 MG PO TABS: 4.0000 mg | ORAL_TABLET | Freq: Three times a day (TID) | ORAL | 0 refills | Status: DC | PRN

## 2017-03-02 MED ORDER — PROPOFOL 10 MG/ML IV BOLUS
INTRAVENOUS | Status: DC | PRN
  Administered 2017-03-02: 150 mg via INTRAVENOUS
  Administered 2017-03-02: 50 mg via INTRAVENOUS

## 2017-03-02 MED ORDER — ROCURONIUM BROMIDE 10 MG/ML (PF) SYRINGE
PREFILLED_SYRINGE | INTRAVENOUS | Status: AC
  Filled 2017-03-02: qty 5

## 2017-03-02 MED ORDER — FENTANYL CITRATE (PF) 100 MCG/2ML IJ SOLN
INTRAMUSCULAR | Status: DC | PRN
  Administered 2017-03-02: 25 ug via INTRAVENOUS
  Administered 2017-03-02: 50 ug via INTRAVENOUS
  Administered 2017-03-02: 25 ug via INTRAVENOUS
  Administered 2017-03-02: 50 ug via INTRAVENOUS
  Administered 2017-03-02: 25 ug via INTRAVENOUS
  Administered 2017-03-02: 50 ug via INTRAVENOUS

## 2017-03-02 MED ORDER — CHLORHEXIDINE GLUCONATE 4 % EX LIQD: 60.0000 mL | Freq: Once | CUTANEOUS | Status: DC

## 2017-03-02 MED ORDER — MIDAZOLAM HCL 5 MG/5ML IJ SOLN
INTRAMUSCULAR | Status: DC | PRN
  Administered 2017-03-02 (×2): 1 mg via INTRAVENOUS

## 2017-03-02 MED ORDER — TRAMADOL HCL 50 MG PO TABS: 50.0000 mg | ORAL_TABLET | Freq: Four times a day (QID) | ORAL | 0 refills | Status: DC | PRN

## 2017-03-02 MED ORDER — ONDANSETRON HCL 4 MG/2ML IJ SOLN
INTRAMUSCULAR | Status: DC | PRN
  Administered 2017-03-02: 4 mg via INTRAVENOUS

## 2017-03-02 MED ORDER — TRANEXAMIC ACID 1000 MG/10ML IV SOLN
1000.0000 mg | INTRAVENOUS | Status: AC
  Administered 2017-03-02: 1000 mg via INTRAVENOUS
  Filled 2017-03-02: qty 1100

## 2017-03-02 MED ORDER — SUGAMMADEX SODIUM 200 MG/2ML IV SOLN
INTRAVENOUS | Status: DC | PRN
  Administered 2017-03-02: 250 mg via INTRAVENOUS

## 2017-03-02 MED ORDER — PROPOFOL 10 MG/ML IV BOLUS
INTRAVENOUS | Status: AC
  Filled 2017-03-02: qty 20

## 2017-03-02 MED ORDER — LIDOCAINE 2% (20 MG/ML) 5 ML SYRINGE
INTRAMUSCULAR | Status: AC
  Filled 2017-03-02: qty 5

## 2017-03-02 MED ORDER — CEFAZOLIN SODIUM-DEXTROSE 2-4 GM/100ML-% IV SOLN
INTRAVENOUS | Status: AC
  Filled 2017-03-02: qty 100

## 2017-03-02 MED ORDER — DEXAMETHASONE SODIUM PHOSPHATE 10 MG/ML IJ SOLN
INTRAMUSCULAR | Status: AC
  Filled 2017-03-02: qty 1

## 2017-03-02 MED ORDER — NAPROXEN 500 MG PO TABS: 500.0000 mg | ORAL_TABLET | Freq: Two times a day (BID) | ORAL | 1 refills | Status: DC

## 2017-03-02 MED ORDER — FENTANYL CITRATE (PF) 100 MCG/2ML IJ SOLN
25.0000 ug | INTRAMUSCULAR | Status: DC | PRN
Start: 2017-03-02 — End: 2017-03-02

## 2017-03-02 MED ORDER — EPHEDRINE 5 MG/ML INJ
INTRAVENOUS | Status: AC
  Filled 2017-03-02: qty 10

## 2017-03-02 MED ORDER — DEXTROSE 5 % IV SOLN
INTRAVENOUS | Status: DC | PRN
  Administered 2017-03-02: 25 ug/min via INTRAVENOUS

## 2017-03-02 MED ORDER — PHENYLEPHRINE 40 MCG/ML (10ML) SYRINGE FOR IV PUSH (FOR BLOOD PRESSURE SUPPORT)
PREFILLED_SYRINGE | INTRAVENOUS | Status: AC
  Filled 2017-03-02: qty 10

## 2017-03-02 MED ORDER — ONDANSETRON HCL 4 MG/2ML IJ SOLN: 4.0000 mg | Freq: Once | INTRAMUSCULAR | Status: DC | PRN

## 2017-03-02 MED ORDER — LACTATED RINGERS IV SOLN
INTRAVENOUS | Status: DC | PRN
  Administered 2017-03-02: 07:00:00 via INTRAVENOUS

## 2017-03-02 MED ORDER — LIDOCAINE HCL (CARDIAC) 20 MG/ML IV SOLN
INTRAVENOUS | Status: DC | PRN
  Administered 2017-03-02: 100 mg via INTRAVENOUS

## 2017-03-02 MED ORDER — HYDROMORPHONE HCL 2 MG PO TABS: 2.0000 mg | ORAL_TABLET | ORAL | 0 refills | Status: DC | PRN

## 2017-03-02 MED ORDER — FENTANYL CITRATE (PF) 250 MCG/5ML IJ SOLN
INTRAMUSCULAR | Status: AC
  Filled 2017-03-02: qty 5

## 2017-03-02 MED ORDER — MIDAZOLAM HCL 2 MG/2ML IJ SOLN
INTRAMUSCULAR | Status: AC
  Filled 2017-03-02: qty 2

## 2017-03-02 MED ORDER — ROCURONIUM BROMIDE 100 MG/10ML IV SOLN
INTRAVENOUS | Status: DC | PRN
  Administered 2017-03-02: 20 mg via INTRAVENOUS
  Administered 2017-03-02: 10 mg via INTRAVENOUS
  Administered 2017-03-02: 60 mg via INTRAVENOUS

## 2017-03-02 MED ORDER — OXYCODONE-ACETAMINOPHEN 5-325 MG PO TABS: 1.0000 | ORAL_TABLET | ORAL | 0 refills | Status: DC | PRN

## 2017-03-02 MED ORDER — DIAZEPAM 5 MG PO TABS: 2.5000 mg | ORAL_TABLET | Freq: Four times a day (QID) | ORAL | 1 refills | Status: DC | PRN

## 2017-03-02 MED ORDER — PHENYLEPHRINE HCL 10 MG/ML IJ SOLN
INTRAMUSCULAR | Status: DC | PRN
  Administered 2017-03-02 (×3): 80 ug via INTRAVENOUS

## 2017-03-02 MED ORDER — 0.9 % SODIUM CHLORIDE (POUR BTL) OPTIME
TOPICAL | Status: DC | PRN
  Administered 2017-03-02: 1000 mL

## 2017-03-02 SURGICAL SUPPLY — 71 items
ADH SKN CLS APL DERMABOND .7 (GAUZE/BANDAGES/DRESSINGS) ×1
ADH SKN CLS LQ APL DERMABOND (GAUZE/BANDAGES/DRESSINGS) ×1
AID PSTN UNV HD RSTRNT DISP (MISCELLANEOUS) ×1
BLADE SAW SGTL 83.5X18.5 (BLADE) ×3 IMPLANT
CEMENT BONE DEPUY (Cement) ×3 IMPLANT
COVER SURGICAL LIGHT HANDLE (MISCELLANEOUS) ×3 IMPLANT
DERMABOND ADHESIVE PROPEN (GAUZE/BANDAGES/DRESSINGS) ×2
DERMABOND ADVANCED (GAUZE/BANDAGES/DRESSINGS) ×2
DERMABOND ADVANCED .7 DNX12 (GAUZE/BANDAGES/DRESSINGS) IMPLANT
DERMABOND ADVANCED .7 DNX6 (GAUZE/BANDAGES/DRESSINGS) ×1 IMPLANT
DRAPE ORTHO SPLIT 77X108 STRL (DRAPES) ×6
DRAPE SURG 17X11 SM STRL (DRAPES) ×3 IMPLANT
DRAPE SURG ORHT 6 SPLT 77X108 (DRAPES) ×2 IMPLANT
DRAPE U-SHAPE 47X51 STRL (DRAPES) ×3 IMPLANT
DRSG AQUACEL AG ADV 3.5X10 (GAUZE/BANDAGES/DRESSINGS) ×3 IMPLANT
DURAPREP 26ML APPLICATOR (WOUND CARE) ×3 IMPLANT
ELECT BLADE 4.0 EZ CLEAN MEGAD (MISCELLANEOUS) ×3
ELECT CAUTERY BLADE 6.4 (BLADE) ×3 IMPLANT
ELECT REM PT RETURN 9FT ADLT (ELECTROSURGICAL) ×3
ELECTRODE BLDE 4.0 EZ CLN MEGD (MISCELLANEOUS) ×1 IMPLANT
ELECTRODE REM PT RTRN 9FT ADLT (ELECTROSURGICAL) ×1 IMPLANT
FACESHIELD WRAPAROUND (MASK) ×9 IMPLANT
FACESHIELD WRAPAROUND OR TEAM (MASK) ×3 IMPLANT
GLENOID WITH CLEAT MEDIUM (Shoulder) ×2 IMPLANT
GLOVE BIO SURGEON STRL SZ7.5 (GLOVE) ×3 IMPLANT
GLOVE BIO SURGEON STRL SZ8 (GLOVE) ×3 IMPLANT
GLOVE BIOGEL PI IND STRL 6 (GLOVE) IMPLANT
GLOVE BIOGEL PI IND STRL 6.5 (GLOVE) IMPLANT
GLOVE BIOGEL PI INDICATOR 6 (GLOVE) ×2
GLOVE BIOGEL PI INDICATOR 6.5 (GLOVE) ×4
GLOVE EUDERMIC 7 POWDERFREE (GLOVE) ×3 IMPLANT
GLOVE SS BIOGEL STRL SZ 7.5 (GLOVE) ×1 IMPLANT
GLOVE SUPERSENSE BIOGEL SZ 7.5 (GLOVE) ×2
GOWN STRL REUS W/ TWL LRG LVL3 (GOWN DISPOSABLE) ×1 IMPLANT
GOWN STRL REUS W/ TWL XL LVL3 (GOWN DISPOSABLE) ×2 IMPLANT
GOWN STRL REUS W/TWL LRG LVL3 (GOWN DISPOSABLE) ×6
GOWN STRL REUS W/TWL XL LVL3 (GOWN DISPOSABLE) ×6
HEAD HUMERAL UNIVERSAL II 52 (Shoulder) ×2 IMPLANT
KIT BASIN OR (CUSTOM PROCEDURE TRAY) ×3 IMPLANT
KIT ROOM TURNOVER OR (KITS) ×3 IMPLANT
KIT SET UNIVERSAL (KITS) ×2 IMPLANT
MANIFOLD NEPTUNE II (INSTRUMENTS) ×3 IMPLANT
NDL SUT .5 MAYO 1.404X.05X (NEEDLE) ×1 IMPLANT
NDL SUT 6 .5 CRC .975X.05 MAYO (NEEDLE) ×1 IMPLANT
NDL TAPERED W/ NITINOL LOOP (MISCELLANEOUS) IMPLANT
NEEDLE MAYO TAPER (NEEDLE)
NEEDLE TAPERED W/ NITINOL LOOP (MISCELLANEOUS) ×3 IMPLANT
NS IRRIG 1000ML POUR BTL (IV SOLUTION) ×3 IMPLANT
PACK SHOULDER (CUSTOM PROCEDURE TRAY) ×3 IMPLANT
PAD ARMBOARD 7.5X6 YLW CONV (MISCELLANEOUS) ×6 IMPLANT
RESTRAINT HEAD UNIVERSAL NS (MISCELLANEOUS) ×3 IMPLANT
SLING ARM FOAM STRAP LRG (SOFTGOODS) IMPLANT
SLING ARM FOAM STRAP XLG (SOFTGOODS) ×2 IMPLANT
SLING ARM XL FOAM STRAP (SOFTGOODS) ×1 IMPLANT
SMARTMIX MINI TOWER (MISCELLANEOUS) ×3
SPONGE LAP 18X18 X RAY DECT (DISPOSABLE) ×5 IMPLANT
SPONGE LAP 4X18 X RAY DECT (DISPOSABLE) ×3 IMPLANT
STEM HUMERAL UNI APEX 10MM (Shoulder) ×2 IMPLANT
SUCTION FRAZIER HANDLE 10FR (MISCELLANEOUS) ×2
SUCTION TUBE FRAZIER 10FR DISP (MISCELLANEOUS) ×1 IMPLANT
SUT FIBERWIRE #2 38 T-5 BLUE (SUTURE) ×12
SUT MNCRL AB 3-0 PS2 18 (SUTURE) ×3 IMPLANT
SUT MON AB 2-0 CT1 36 (SUTURE) ×3 IMPLANT
SUT VIC AB 1 CT1 27 (SUTURE) ×3
SUT VIC AB 1 CT1 27XBRD ANBCTR (SUTURE) ×1 IMPLANT
SUTURE FIBERWR #2 38 T-5 BLUE (SUTURE) ×4 IMPLANT
SYR CONTROL 10ML LL (SYRINGE) ×2 IMPLANT
TOWEL OR 17X24 6PK STRL BLUE (TOWEL DISPOSABLE) ×1 IMPLANT
TOWEL OR 17X26 10 PK STRL BLUE (TOWEL DISPOSABLE) ×3 IMPLANT
TOWER SMARTMIX MINI (MISCELLANEOUS) ×1 IMPLANT
WATER STERILE IRR 1000ML POUR (IV SOLUTION) ×1 IMPLANT

## 2017-03-02 NOTE — Anesthesia Procedure Notes (Addendum)
Anesthesia Regional Block: Interscalene brachial plexus block   Pre-Anesthetic Checklist: ,, timeout performed, Correct Patient, Correct Site, Correct Laterality, Correct Procedure,, site marked, risks and benefits discussed, Surgical consent,  Pre-op evaluation,  At surgeon's request and post-op pain management  Laterality: Left  Prep: chloraprep       Needles:  Injection technique: Single-shot  Needle Type: Echogenic Stimulator Needle     Needle Length: 9cm  Needle Gauge: 21     Additional Needles:   Procedures:,,,, ultrasound used (permanent image in chart),,,,  Narrative:  Start time: 03/02/2017 7:00 AM End time: 03/02/2017 7:10 AM Injection made incrementally with aspirations every 5 mL.  Performed by: Personally  Anesthesiologist: Murvin Natal, MD  Additional Notes: Functioning IV was confirmed and monitors were applied.  A 58mm 21ga Arrow echogenic stimulator needle was used. Sterile prep, hand hygiene and sterile gloves were used.  Negative aspiration and negative test dose prior to incremental administration of local anesthetic. Patient experienced a paraesthesia on initial insertion of needle. Needle immediately repositioned with resolution of symptoms. The patient tolerated the procedure well.

## 2017-03-02 NOTE — Anesthesia Postprocedure Evaluation (Signed)
Anesthesia Post Note  Patient: Ricky Wilson  Procedure(s) Performed: LEFT TOTAL SHOULDER ARTHROPLASTY (Left Shoulder)     Patient location during evaluation: PACU Anesthesia Type: Regional and General Level of consciousness: awake and alert Pain management: pain level controlled Vital Signs Assessment: post-procedure vital signs reviewed and stable Respiratory status: spontaneous breathing, nonlabored ventilation, respiratory function stable and patient connected to nasal cannula oxygen Cardiovascular status: blood pressure returned to baseline and stable Postop Assessment: no apparent nausea or vomiting Anesthetic complications: no    Last Vitals:  Vitals:   03/02/17 1125 03/02/17 1300  BP: 133/82 128/80  Pulse: 70 71  Resp: 15 16  Temp:    SpO2: 95% 97%    Last Pain:  Vitals:   03/02/17 1034  TempSrc:   PainSc: 2                  Ryan P Ellender

## 2017-03-02 NOTE — Anesthesia Procedure Notes (Signed)
Procedure Name: Intubation Date/Time: 03/02/2017 7:47 AM Performed by: Candis Shine, CRNA Pre-anesthesia Checklist: Patient identified, Emergency Drugs available, Suction available and Patient being monitored Patient Re-evaluated:Patient Re-evaluated prior to induction Oxygen Delivery Method: Circle system utilized Preoxygenation: Pre-oxygenation with 100% oxygen Induction Type: IV induction Ventilation: Mask ventilation without difficulty Laryngoscope Size: Mac and 3 Grade View: Grade I Tube type: Oral Tube size: 7.5 mm Number of attempts: 2 Airway Equipment and Method: Patient positioned with wedge pillow and Stylet Placement Confirmation: ETT inserted through vocal cords under direct vision,  positive ETCO2 and breath sounds checked- equal and bilateral Secured at: 23 cm Tube secured with: Tape Dental Injury: Teeth and Oropharynx as per pre-operative assessment

## 2017-03-02 NOTE — Progress Notes (Signed)
PA instructed not to d/c patient until she can swing by to see him in Phase 2.  Awaiting arrival.

## 2017-03-02 NOTE — Op Note (Signed)
03/02/2017  9:51 AM  PATIENT:   Ricky Wilson  61 y.o. male  PRE-OPERATIVE DIAGNOSIS:  Left shoulder osteoarthritis   POST-OPERATIVE DIAGNOSIS:  same  PROCEDURE:  L TSA #10 stem, 50x22 head, medium glenoid  SURGEON:  Abou Sterkel, Metta Clines M.D.  ASSISTANTS: Shuford pac   ANESTHESIA:   GET + ISB  EBL: 150  SPECIMEN:  none  Drains: none   PATIENT DISPOSITION:  PACU - hemodynamically stable.    PLAN OF CARE: Discharge to home after PACU  Dictation# [id number]   Contact # [phone]

## 2017-03-02 NOTE — H&P (Signed)
[name]    Chief Complaint: Left shoulder osteoarthritis  HPI: The patient is a 61 y.o. male with end stage left shoulder OA  Past Medical History:  Diagnosis Date  . Arthritis   . Carpal tunnel syndrome, bilateral   . Coronary atherosclerosis of native coronary artery    Occluded RCA with collaterals 2004  . Gout   . Hypertension   . Mixed hyperlipidemia     Past Surgical History:  Procedure Laterality Date  . CARPAL TUNNEL RELEASE  03/31/2011   Procedure: CARPAL TUNNEL RELEASE;  Surgeon: Cammie Sickle., MD;  Location: [facility];  Service: Orthopedics;  Laterality: Left;  . CARPAL TUNNEL RELEASE  05/17/2011   Procedure: CARPAL TUNNEL RELEASE;  Surgeon: Cammie Sickle., MD;  Location: [facility];  Service: Orthopedics;  Laterality: Right;  . ELBOW SURGERY  2008   right  . tooth extract     and bone grafts  . TORN LEFT MEDIAL MENISCUS  2008  . TRIGGER FINGER RELEASE Bilateral    thumbs  . VASECTOMY  1977    Family History  Problem Relation Age of Onset  . Coronary artery disease Unknown     Social History:  reports that  has never smoked. he has never used smokeless tobacco. He reports that he drinks alcohol. He reports that he does not use drugs.   Medications Prior to Admission  Medication Sig Dispense Refill  . aspirin EC 81 MG tablet Take 81 mg by mouth daily.    . benazepril (LOTENSIN) 10 MG tablet Take 1 tablet (10 mg total) by mouth every evening. 90 tablet 3  . diclofenac sodium (VOLTAREN) 1 % GEL Voltaren Gel 3 grams to 3 large joints upto TID 3 TUBES with 3 refills (Patient taking differently: Apply 4 g topically as needed (SHOULDER PAIN). ) 3 Tube 3  . Febuxostat (ULORIC) 80 MG TABS Take 1 tablet (80 mg total) by mouth daily. (Patient taking differently: Take 40 mg by mouth daily. ) 90 tablet 1  . Ginger, Zingiber officinalis, (GINGER ROOT PO) Take 550 mg by mouth 2 (two) times daily.     . metoprolol succinate  (TOPROL XL) 25 MG 24 hr tablet Take 1 tablet (25 mg total) by mouth daily. 30 tablet 6  . Misc Natural Products (TART CHERRY ADVANCED PO) Take 1,200 mg by mouth 2 (two) times daily.     . Multiple Vitamin (MULTIVITAMIN) tablet Take 1 tablet by mouth daily.      . Omega-3 Fatty Acids (FISH OIL) 1000 MG CAPS Take 1 capsule (1,000 mg total) by mouth 2 (two) times daily. 60 each 0  . rosuvastatin (CRESTOR) 10 MG tablet Take 10 mg by mouth every evening.     . Turmeric 500 MG TABS Take 500 mg by mouth 2 (two) times daily.        Physical Exam: left shoulder with painful and restricted motion as noted at recent office visits  Vitals  Temp:  [97.9 F (36.6 C)] 97.9 F (36.6 C) (12/06 0544) Pulse Rate:  [62] 62 (12/06 0544) Resp:  [20] 20 (12/06 0544) BP: (161)/(84) 161/84 (12/06 0544) SpO2:  [98 %] 98 % (12/06 0544) Weight:  [102.1 kg (225 lb)] 102.1 kg (225 lb) (12/06 0544)  Assessment/Plan  Impression: Left shoulder osteoarthritis   Plan of Action: Procedure(s): LEFT TOTAL SHOULDER ARTHROPLASTY  Ricky Wilson 03/02/2017, 7:26 AM Contact # [phone]

## 2017-03-02 NOTE — Transfer of Care (Signed)
Immediate Anesthesia Transfer of Care Note  Patient: [facility]  Procedure(s) Performed: LEFT TOTAL SHOULDER ARTHROPLASTY (Left Shoulder)  Patient Location: PACU  Anesthesia Type:GA combined with regional for post-op pain  Level of Consciousness: awake, alert  and oriented  Airway & Oxygen Therapy: Patient Spontanous Breathing and Patient connected to nasal cannula oxygen  Post-op Assessment: Report given to RN and Post -op Vital signs reviewed and stable  Post vital signs: Reviewed and stable  Last Vitals:  Vitals:   03/02/17 0704 03/02/17 1004  BP:  (!) 144/82  Pulse: 66 75  Resp: 14 12  Temp:  36.7 C  SpO2: 98% 95%    Last Pain:  Vitals:   03/02/17 1004  TempSrc:   PainSc: Asleep      Patients Stated Pain Goal: 0 (15/52/08 0223)  Complications: No apparent anesthesia complications

## 2017-03-02 NOTE — Discharge Instructions (Signed)

## 2017-03-02 NOTE — Anesthesia Preprocedure Evaluation (Addendum)
Anesthesia Evaluation  Patient identified by MRN, date of birth, ID band Patient awake    Reviewed: Allergy & Precautions, H&P , Patient's Chart, lab work & pertinent test results  Airway Mallampati: II  TM Distance: >3 FB Neck ROM: full    Dental   Pulmonary neg pulmonary ROS,    Pulmonary exam normal        Cardiovascular hypertension, On Medications + CAD  Normal cardiovascular exam  ECG: SB, rate 55  PCP is Jerene Bears, MD Cardiologist is Rozann Lesches, MD. LAst office visit 01/16/17  Nuclear stress test 01/21/16:  No diagnostic ST segment changes to indicate ischemia. No significant myocardial perfusion defects to indicate scar or ischemia. This is a low risk study. Nuclear stress EF: 58%.  Cardiac cath 02/15/03 (labeled operative note in Epic): 1. LM: Large vessel, angiographically unremarkable 2. LAD: Proximal 25% stenosis. Diffuse disease throughout proximal to mid portion, but no occlusive disease or obstructive disease. 4 small diagonal branches have luminal irregularities 3. CX: Has a number of small OM was in a large posterior lateral branch. Bifurcates along the inferior lateral wall. 50% lesion in the midportion of OM 4. RCA: Large dominant vessel is occluded in its proximal portion. Fills 5 right to right and left to right collaterals. Moderate caliber posterior descending during coronary artery disease Treated medically         Neuro/Psych  Neuromuscular disease    GI/Hepatic negative GI ROS, Neg liver ROS,   Endo/Other  negative endocrine ROS  Renal/GU negative Renal ROS     Musculoskeletal   Abdominal Normal abdominal exam  (+) + obese,   Peds  Hematology   Anesthesia Other Findings Mixed hyperlipidemia Gout  Reproductive/Obstetrics                            Anesthesia Physical  Anesthesia Plan  ASA: II  Anesthesia Plan: General and Regional   Post-op  Pain Management: GA combined w/ Regional for post-op pain   Induction: Intravenous  PONV Risk Score and Plan: 2 and Dexamethasone, Ondansetron and Midazolam  Airway Management Planned: Oral ETT  Additional Equipment:   Intra-op Plan:   Post-operative Plan: Extubation in OR  Informed Consent: I have reviewed the patients History and Physical, chart, labs and discussed the procedure including the risks, benefits and alternatives for the proposed anesthesia with the patient or authorized representative who has indicated his/her understanding and acceptance.   Dental Advisory Given and Dental advisory given  Plan Discussed with: CRNA  Anesthesia Plan Comments:         Anesthesia Quick Evaluation

## 2017-03-03 ENCOUNTER — Encounter (HOSPITAL_COMMUNITY): Payer: Self-pay | Admitting: Orthopedic Surgery

## 2017-03-03 NOTE — Op Note (Signed)
NAME:  Ricky Wilson, Ricky Wilson                     ACCOUNT NO.:  MEDICAL RECORD NO.:  [id number]  LOCATION:                                 FACILITY:  PHYSICIAN:  Metta Clines. Keitra Carusone, M.D.       DATE OF BIRTH:  DATE OF PROCEDURE:  03/02/2017 DATE OF DISCHARGE:                              OPERATIVE REPORT   PREOPERATIVE DIAGNOSIS:  End-stage left shoulder osteoarthritis.  POSTOPERATIVE DIAGNOSIS:  End-stage left shoulder osteoarthritis.  PROCEDURE:  A left total shoulder arthroplasty utilizing a press-fit size 10 Arthrex stem with a 50 x 20 eccentric head on a medium glenoid.  SURGEON:  Metta Clines. Abeera Flannery, M.D.  Terrence DupontOlivia Mackie A. Shuford, P.A.-C.  ANESTHESIA:  General endotracheal as well as interscalene block.  BLOOD LOSS:  150 mL.  DRAINS:  None.  HISTORY:  Ricky Wilson is a 61 year old gentleman, who has had chronic and progressive increasing left shoulder pain related to end-stage osteoarthritis.  His symptoms have deteriorated to the point where he is having significant functional limitations, he is brought to the operating room at this time, for planned left total shoulder arthroplasty.  Preoperatively, I counseled Ricky Wilson regarding treatment options and potential risks versus benefits thereof.  Possible surgical complications were all reviewed including bleeding, infection, neurovascular injury, persistent pain, loss of motion, anesthetic complication, failure of the implant, and possible need for additional surgery.  He understands and accepts and agrees with the planned procedure.  PROCEDURE IN DETAIL:  After undergoing routine preop evaluation, the patient received prophylactic antibiotics.  Interscalene block was established in the holding area by the anesthesia department.  Placed supine on the operating table.  Underwent smooth induction of a general endotracheal anesthesia.  Placed in the beach-chair position and appropriately padded and protected.  Left shoulder  girdle region was sterilely prepped and draped in standard fashion.  Time-out was called. An anterior deltopectoral approach to left shoulder was made through a 10 cm incision.  Skin flaps were elevated.  Dissection carried deeply. Deltopectoral interval was then developed from proximal to distal with the upper centimeter of the pec major tendon tenotomized to enhance exposure.  Adhesions divided beneath the deltoid.  Conjoint tendon mobilized and retracted medially.  At this point, the long head biceps tendon was then tenodesed at the upper border of the pec major, it was then unroofed and tenotomized and resected.  We then divided the subscapularis away from its attachment to the lesser tuberosity leaving a 1 cm cuff of tissue for later repair and passed a series of four #2 FiberWire grasping sutures through the free margin of the subscapularis for later repair.  We split the rotator cuff along the rotator interval at the base of the coracoid.  The capsular attachments from the anterior- inferior and inferior aspect of the humeral neck were then divided and the humeral head was delivered through the wound.  Of note, significant stiffness was encountered and significant releases and effort was required to gain appropriate exposure due to large muscular about the shoulder and large osteophytes and significant joint deterioration.  We were ultimately able to gain access to the humeral head, where  we then outlined our proposed humeral head resection using the extramedullary guide and this was then resected using an oscillating saw maintaining the native retroversion and carefully protecting the rotator cuff.  We then removed the osteophytes from the anterior and inferior margins of the humeral neck.  The humeral canal was then prepared, hand reaming and then broaching up to size 10 with excellent fit.  A size 9 trial with metal cap placed into the humeral canal.  We then directed our  attention to the glenoid and gained exposure with Doreatha Martin, pitchfork, and snake tongue retractors.  Again, this still was significantly challenging to [place] due to his large musculature and large body habitus.  Once we gained exposure, the labrum was excised circumferentially, gained complete visualization of the periphery of the glenoid.  Some mild retroversion was noted.  We used a size medium glenoid guide to place the standard glenoid.  We then reamed the glenoid correcting perhaps 5-10 degrees of retroversion.  We then placed our central drill hole, followed by the superior and inferior peg and slot respectively.  The glenoid was then broached, trial showed excellent fit.  We then irrigated the glenoid, it was cleaned and dried.  We then mixed cement and introduced this into the superior and inferior peg and slot respectively.  The glenoid was then impacted with excellent fit and fixation.  We then returned our attention to the proximal humerus, where canal was cleared.  Our size 10 stem was then impacted with excellent fit and fixation.  The proximal locking screws were then tightened.  We then performed a series of trial reductions, ultimately felt that the 50 x 22 eccentric head gave [place] excellent soft tissue balance with approximately 50% translation of the humeral head and glenoid.  The final humeral head was then impacted onto the [facility] taper after it was meticulously cleaned and dried.  The final reduction was then performed again much to our satisfaction with overall stability, alignment, and soft tissue balance.  Subscapularis was then repaired back to lesser tuberosity using #2 FiberWires and the rotator interval was also repaired with a pair of #2 FiberWire figure-of-eight sutures.  Wound was irrigated.  Hemostasis was obtained.  The deltopectoral interval was then reapproximated with a series of figure- of-eight #1 Vicryl sutures.  2-0 Vicryl used for subcu  layer, intracuticular 3-0 Monocryl for the skin, followed by Dermabond and Aquacel dressing.  Left arm was placed in a sling.  The patient was awakened, extubated, and taken to the recovery room in stable condition.  Jenetta Loges, P.A.-C was used as an Environmental consultant throughout this case, essential for help with positioning the patient, positioning the extremity, tissue manipulation, suture management, implantation of the prosthesis, wound closure, and intraoperative decision making.     Metta Clines. Tyus Kallam, M.D.     KMS/MEDQ  D:  03/02/2017  T:  03/02/2017  Job:  [id number]

## 2017-03-07 ENCOUNTER — Ambulatory Visit: Payer: 59 | Admitting: Rheumatology

## 2017-06-23 ENCOUNTER — Other Ambulatory Visit: Payer: Self-pay | Admitting: Cardiology

## 2017-07-26 NOTE — Progress Notes (Signed)
Cardiology Office Note  Date: 07/28/2017   ID: Joslyn Hy Dewar, [place] [birth year], MRN [id number]   PCP: Glenda Chroman, MD  Primary Cardiologist: Rozann Lesches, MD   Chief Complaint  Patient presents with  . Coronary Artery Disease    History of Present Illness: Ricky Wilson is a 62 y.o. male last seen in October 2018.  He presents for a routine visit.  Overall doing well from a cardiac perspective without angina symptoms or nitroglycerin use. He underwent left shoulder arthroplasty in December 2018.  States that he is nearly back to baseline.  He is no longer doing any weight exercises with his arms, we did talk about general aerobic fitness.  He continues to enjoy working outside.  I reviewed his medications which are stable from a cardiac perspective and outlined below.  We did discuss cutting his Toprol-XL back to 12.5 mg daily for now.  He states that his heart rate rarely gets much faster than the 70s or 80s when he exercises.  He continues on Crestor, last LDL was 53.   Past Medical History:  Diagnosis Date  . Arthritis   . Carpal tunnel syndrome, bilateral   . Coronary atherosclerosis of native coronary artery    Occluded RCA with collaterals 2004  . Gout   . Hypertension   . Mixed hyperlipidemia     Past Surgical History:  Procedure Laterality Date  . CARPAL TUNNEL RELEASE  03/31/2011   Procedure: CARPAL TUNNEL RELEASE;  Surgeon: Cammie Sickle., MD;  Location: [place];  Service: Orthopedics;  Laterality: Left;  . CARPAL TUNNEL RELEASE  05/17/2011   Procedure: CARPAL TUNNEL RELEASE;  Surgeon: Cammie Sickle., MD;  Location: [place];  Service: Orthopedics;  Laterality: Right;  . ELBOW SURGERY  2008   right  . tooth extract     and bone grafts  . TORN LEFT MEDIAL MENISCUS  2008  . TOTAL SHOULDER ARTHROPLASTY Left 03/02/2017   Procedure: LEFT TOTAL SHOULDER ARTHROPLASTY;  Surgeon: Justice Britain, MD;  Location: [place];  Service: Orthopedics;  Laterality: Left;  . TRIGGER FINGER RELEASE Bilateral    thumbs  . VASECTOMY  1977    Current Outpatient Medications  Medication Sig Dispense Refill  . aspirin EC 81 MG tablet Take 81 mg by mouth daily.    . benazepril (LOTENSIN) 10 MG tablet TAKE 1 TABLET (10 MG TOTAL) BY MOUTH EVERY EVENING. 90 tablet 3  . diclofenac sodium (VOLTAREN) 1 % GEL Voltaren Gel 3 grams to 3 large joints upto TID 3 TUBES with 3 refills (Patient taking differently: Apply 4 g topically as needed (SHOULDER PAIN). ) 3 Tube 3  . Multiple Vitamin (MULTIVITAMIN) tablet Take 1 tablet by mouth daily.      . Omega-3 Fatty Acids (FISH OIL) 1000 MG CAPS Take 1 capsule (1,000 mg total) by mouth 2 (two) times daily. 60 each 0  . rosuvastatin (CRESTOR) 10 MG tablet Take 10 mg by mouth every evening.     . metoprolol succinate (TOPROL XL) 25 MG 24 hr tablet Take 0.5 tablets (12.5 mg total) by mouth daily. 45 tablet 3   No current facility-administered medications for this visit.    Allergies:  Hydrocodone   Social History: The patient  reports that he has never smoked. He has never used smokeless tobacco. He reports that he drinks alcohol. He reports that he does not use drugs.   ROS:  Please see the  history of present illness. Otherwise, complete review of systems is positive for arthritic stiffness in his hands.  All other systems are reviewed and negative.   Physical Exam: VS:  BP (!) 150/70   Pulse (!) 56   Ht 5\' 11"  (1.803 m)   Wt 208 lb (94.3 kg)   BMI 29.01 kg/m , BMI Body mass index is 29.01 kg/m.  Wt Readings from Last 3 Encounters:  07/28/17 208 lb (94.3 kg)  03/02/17 225 lb (102.1 kg)  02/21/17 225 lb (102.1 kg)    General: Patient appears comfortable at rest. HEENT: Conjunctiva and lids normal, oropharynx clear. Neck: Supple, no elevated JVP or carotid bruits, no thyromegaly. Lungs: Clear to auscultation, nonlabored breathing at rest. Cardiac: Regular rate and rhythm, no  S3 or significant systolic murmur, no pericardial rub. Abdomen: Soft, nontender, bowel sounds present. Extremities: No pitting edema, distal pulses 2+.  Arthritic deformities in hands. Skin: Warm and dry. Musculoskeletal: No kyphosis. Neuropsychiatric: Alert and oriented x3, affect grossly appropriate.  ECG: I personally reviewed the tracing from 01/16/2017 which showed sinus bradycardia.  Recent Labwork: 02/21/2017: BUN 9; Creatinine, Ser 1.09; Hemoglobin 15.1; Platelets 155; Potassium 4.3; Sodium 138  October 2018: Cholesterol 132, triglycerides 213, HDL 36, LDL 53, AST 44, ALT 63  Other Studies Reviewed Today:  Lexiscan Myoview 01/21/2016:  No diagnostic ST segment changes to indicate ischemia.  No significant myocardial perfusion defects to indicate scar or ischemia.  This is a low risk study.  Nuclear stress EF: 58%.  Assessment and Plan:  1.  CAD with known occlusion of the RCA associated with collaterals.  He reports no active angina on medical therapy.  Last Myoview was in 2017, no clear indication for follow-up ischemic testing at this time.  Discussed basic exercise plan and medical therapy.  We will reduce his Toprol-XL to 12.5 mg daily for now.  2.  Mixed hyperlipidemia, tolerating Crestor.  Last LDL 53.  3.  Essential hypertension, blood pressure is mildly elevated today.  He has lost weight, try to get under 200 pounds.  He does have further room to increase ACE inhibitor if needed, keep follow-up with Dr. Woody Seller.  4.  Erectile dysfunction, reports some improvement after cutting back beta-blocker dose.  Current medicines were reviewed with the patient today.  Disposition: Follow-up in 6 months.  Signed, Satira Sark, MD, [facility] 07/28/2017 9:01 AM    [facility] [place], [place], [place] [address] Phone: [phone]; Fax: [phone]

## 2017-07-28 ENCOUNTER — Other Ambulatory Visit: Payer: Self-pay

## 2017-07-28 ENCOUNTER — Ambulatory Visit: Payer: 59 | Admitting: Cardiology

## 2017-07-28 ENCOUNTER — Encounter: Payer: Self-pay | Admitting: *Deleted

## 2017-07-28 VITALS — BP 150/70 | HR 56 | Ht 71.0 in | Wt 208.0 lb

## 2017-07-28 DIAGNOSIS — I25119 Atherosclerotic heart disease of native coronary artery with unspecified angina pectoris: Secondary | ICD-10-CM | POA: Diagnosis not present

## 2017-07-28 DIAGNOSIS — E782 Mixed hyperlipidemia: Secondary | ICD-10-CM

## 2017-07-28 DIAGNOSIS — I10 Essential (primary) hypertension: Secondary | ICD-10-CM

## 2017-07-28 DIAGNOSIS — N529 Male erectile dysfunction, unspecified: Secondary | ICD-10-CM | POA: Diagnosis not present

## 2017-07-28 MED ORDER — METOPROLOL SUCCINATE ER 25 MG PO TB24: 12.5000 mg | ORAL_TABLET | Freq: Every day | ORAL | 3 refills | Status: DC

## 2017-07-28 NOTE — Patient Instructions (Addendum)
Medication Instructions:  Your physician has recommended you make the following change in your medication:    DECREASE Toprol XL to 12.5 mg daily   Please continue all other medications as prescribed  Labwork: NONE  Testing/Procedures: NONE  Follow-Up: Your physician wants you to follow-up in: [place] DR. Domenic Polite You will receive a reminder letter in the mail two months in advance. If you don't receive a letter, please call our office to schedule the follow-up appointment.  Any Other Special Instructions Will Be Listed Below (If Applicable).  If you need a refill on your cardiac medications before your next appointment, please call your pharmacy.

## 2017-08-10 ENCOUNTER — Other Ambulatory Visit: Payer: Self-pay | Admitting: Cardiology

## 2018-02-05 NOTE — Progress Notes (Signed)
Cardiology Office Note  Date: 02/07/2018   ID: Ricky Wilson, [place] 07-08-1955, MRN [id number]  PCP: Ricky Chroman, MD  Primary Cardiologist: Ricky Lesches, MD   Chief Complaint  Patient presents with  . Coronary Artery Disease    History of Present Illness: Ricky Wilson is a 62 y.o. male last seen in May.  He is here for a routine follow-up visit.  He does not report any angina symptoms.  Does have some shortness of breath with high levels of activity.  He describes a particular incident when he was carrying a very heavy piece of furniture up a flight of steps.  He has joined [employer] and plans to start exercising more regularly soon.  Weight has been stable, he would like to lose some.  At the last visit we did cut Toprol-XL back to 12.5 mg daily.  Does have erectile dysfunction, has used Cialis per Dr. Woody Wilson.  No specific cardiac concerns for him continuing this for now.  I reviewed his interval lab work as outlined below.  LDL was 55 on Crestor.  He reports no intolerances.  I personally reviewed his ECG which shows sinus rhythm with IVCD.  Past Medical History:  Diagnosis Date  . Arthritis   . Carpal tunnel syndrome, bilateral   . Coronary atherosclerosis of native coronary artery    Occluded RCA with collaterals 2004  . Gout   . Hypertension   . Mixed hyperlipidemia     Past Surgical History:  Procedure Laterality Date  . CARPAL TUNNEL RELEASE  03/31/2011   Procedure: CARPAL TUNNEL RELEASE;  Surgeon: Ricky Wilson., MD;  Location: [place];  Service: Orthopedics;  Laterality: Left;  . CARPAL TUNNEL RELEASE  05/17/2011   Procedure: CARPAL TUNNEL RELEASE;  Surgeon: Ricky Wilson., MD;  Location: [place];  Service: Orthopedics;  Laterality: Right;  . ELBOW SURGERY  2008   right  . tooth extract     and bone grafts  . TORN LEFT MEDIAL MENISCUS  2008  . TOTAL SHOULDER ARTHROPLASTY Left 03/02/2017   Procedure: LEFT TOTAL SHOULDER ARTHROPLASTY;  Surgeon: Ricky Britain, MD;  Location: [place];  Service: Orthopedics;  Laterality: Left;  . TRIGGER FINGER RELEASE Bilateral    thumbs  . VASECTOMY  1977    Current Outpatient Medications  Medication Sig Dispense Refill  . aspirin EC 81 MG tablet Take 81 mg by mouth daily.    . benazepril (LOTENSIN) 10 MG tablet TAKE 1 TABLET (10 MG TOTAL) BY MOUTH EVERY EVENING. 90 tablet 3  . diclofenac sodium (VOLTAREN) 1 % GEL Voltaren Gel 3 grams to 3 large joints upto TID 3 TUBES with 3 refills (Patient taking differently: Apply 4 g topically as needed (SHOULDER PAIN). ) 3 Tube 3  . diphenhydrAMINE (BENADRYL) 25 MG tablet Take 25 mg by mouth as needed.    . metoprolol succinate (TOPROL XL) 25 MG 24 hr tablet Take 0.5 tablets (12.5 mg total) by mouth daily. 45 tablet 3  . Multiple Vitamin (MULTIVITAMIN) tablet Take 1 tablet by mouth daily.      . Omega-3 Fatty Acids (FISH OIL) 1000 MG CAPS Take 1 capsule (1,000 mg total) by mouth 2 (two) times daily. 60 each 0  . rosuvastatin (CRESTOR) 10 MG tablet Take 10 mg by mouth every evening.      No current facility-administered medications for this visit.    Allergies:  Hydrocodone   Social History: The  patient  reports that he has never smoked. He has never used smokeless tobacco. He reports that he drinks alcohol. He reports that he does not use drugs.   ROS:  Please see the history of present illness. Otherwise, complete review of systems is positive for none.  All other systems are reviewed and negative.   Physical Exam: VS:  BP 140/72   Pulse 77   Ht 5\' 11"  (1.803 m)   Wt 216 lb 9.6 oz (98.2 kg)   SpO2 94%   BMI 30.21 kg/m , BMI Body mass index is 30.21 kg/m.  Wt Readings from Last 3 Encounters:  02/07/18 216 lb 9.6 oz (98.2 kg)  07/28/17 208 lb (94.3 kg)  03/02/17 225 lb (102.1 kg)    General: Patient appears comfortable at rest. HEENT: Conjunctiva and lids normal, oropharynx clear. Neck:  Supple, no elevated JVP or carotid bruits, no thyromegaly. Lungs: Clear to auscultation, nonlabored breathing at rest. Cardiac: Regular rate and rhythm, no S3 or significant systolic murmur. Abdomen: Soft, nontender, bowel sounds present. Extremities: No pitting edema, distal pulses 2+.  Hand arthritic deformities. Skin: Warm and dry. Musculoskeletal: No kyphosis. Neuropsychiatric: Alert and oriented x3, affect grossly appropriate.  ECG: I personally reviewed the tracing from 01/16/2017 which showed sinus bradycardia.  Recent Labwork: 02/21/2017: BUN 9; Creatinine, Ser 1.09; Hemoglobin 15.1; Platelets 155; Potassium 4.3; Sodium 138  October 2018: Cholesterol 132, triglycerides 213, HDL 36, LDL 53, AST 44, ALT 63 July 2019: Hemoglobin 14.1, platelets 186, BUN 15, creatinine 1.13, potassium 4.9, AST 25, ALT 29, cholesterol 125, triglycerides 116, HDL 47, LDL 55, TSH 2.48  Other Studies Reviewed Today:  Lexiscan Myoview 01/21/2016:  No diagnostic ST segment changes to indicate ischemia.  No significant myocardial perfusion defects to indicate scar or ischemia.  This is a low risk study.  Nuclear stress EF: 58%.  Assessment and Plan:  1.  CAD with occlusion of the RCA associated with collaterals.  He has been symptomatically stable without progressive angina, last ischemic testing was in 2017.  I encouraged him to participate in regular exercise as planned and keep an eye on any changes in stamina or new symptoms.  No changes made in current medications.  2.  Mixed hyperlipidemia on Crestor with most recent LDL 55.  3.  Essential hypertension, no changes made to current regimen.  Weight loss would be beneficial.  4.  Erectile dysfunction.  We have cut back beta-blocker.  He also uses Cialis per Dr. Woody Wilson.  Current medicines were reviewed with the patient today.   Orders Placed This Encounter  Procedures  . EKG 12-Lead    Disposition: Follow-up in 6 months.  Signed, Ricky Sark, MD, [facility] 02/07/2018 9:03 AM    [facility] [place], [place], [place] [address] Phone: [phone]; Fax: [phone]

## 2018-02-06 ENCOUNTER — Encounter: Payer: Self-pay | Admitting: *Deleted

## 2018-02-07 ENCOUNTER — Ambulatory Visit: Payer: 59 | Admitting: Cardiology

## 2018-02-07 ENCOUNTER — Encounter: Payer: Self-pay | Admitting: Cardiology

## 2018-02-07 VITALS — BP 140/72 | HR 77 | Ht 71.0 in | Wt 216.6 lb

## 2018-02-07 DIAGNOSIS — E782 Mixed hyperlipidemia: Secondary | ICD-10-CM

## 2018-02-07 DIAGNOSIS — I10 Essential (primary) hypertension: Secondary | ICD-10-CM

## 2018-02-07 DIAGNOSIS — N529 Male erectile dysfunction, unspecified: Secondary | ICD-10-CM

## 2018-02-07 DIAGNOSIS — I25119 Atherosclerotic heart disease of native coronary artery with unspecified angina pectoris: Secondary | ICD-10-CM

## 2018-02-07 NOTE — Patient Instructions (Addendum)

## 2018-05-29 DIAGNOSIS — E049 Nontoxic goiter, unspecified: Secondary | ICD-10-CM | POA: Diagnosis not present

## 2018-05-29 DIAGNOSIS — E559 Vitamin D deficiency, unspecified: Secondary | ICD-10-CM | POA: Diagnosis not present

## 2018-05-29 DIAGNOSIS — E210 Primary hyperparathyroidism: Secondary | ICD-10-CM | POA: Diagnosis not present

## 2018-05-29 DIAGNOSIS — I10 Essential (primary) hypertension: Secondary | ICD-10-CM | POA: Diagnosis not present

## 2018-05-30 DIAGNOSIS — E210 Primary hyperparathyroidism: Secondary | ICD-10-CM | POA: Diagnosis not present

## 2018-06-05 ENCOUNTER — Other Ambulatory Visit: Payer: Self-pay | Admitting: Endocrinology

## 2018-06-05 DIAGNOSIS — E049 Nontoxic goiter, unspecified: Secondary | ICD-10-CM

## 2018-06-06 ENCOUNTER — Other Ambulatory Visit (HOSPITAL_COMMUNITY): Payer: Self-pay | Admitting: Endocrinology

## 2018-06-06 ENCOUNTER — Other Ambulatory Visit: Payer: Self-pay | Admitting: Endocrinology

## 2018-06-06 DIAGNOSIS — Z299 Encounter for prophylactic measures, unspecified: Secondary | ICD-10-CM | POA: Diagnosis not present

## 2018-06-06 DIAGNOSIS — E039 Hypothyroidism, unspecified: Secondary | ICD-10-CM | POA: Diagnosis not present

## 2018-06-06 DIAGNOSIS — E7800 Pure hypercholesterolemia, unspecified: Secondary | ICD-10-CM | POA: Diagnosis not present

## 2018-06-06 DIAGNOSIS — Z79899 Other long term (current) drug therapy: Secondary | ICD-10-CM | POA: Diagnosis not present

## 2018-06-06 DIAGNOSIS — Z1339 Encounter for screening examination for other mental health and behavioral disorders: Secondary | ICD-10-CM | POA: Diagnosis not present

## 2018-06-06 DIAGNOSIS — E210 Primary hyperparathyroidism: Secondary | ICD-10-CM

## 2018-06-06 DIAGNOSIS — Z6829 Body mass index (BMI) 29.0-29.9, adult: Secondary | ICD-10-CM | POA: Diagnosis not present

## 2018-06-06 DIAGNOSIS — Z0000 Encounter for general adult medical examination without abnormal findings: Secondary | ICD-10-CM | POA: Diagnosis not present

## 2018-06-06 DIAGNOSIS — Z7189 Other specified counseling: Secondary | ICD-10-CM | POA: Diagnosis not present

## 2018-06-06 DIAGNOSIS — Z125 Encounter for screening for malignant neoplasm of prostate: Secondary | ICD-10-CM | POA: Diagnosis not present

## 2018-06-06 DIAGNOSIS — I10 Essential (primary) hypertension: Secondary | ICD-10-CM | POA: Diagnosis not present

## 2018-06-06 DIAGNOSIS — R5383 Other fatigue: Secondary | ICD-10-CM | POA: Diagnosis not present

## 2018-06-06 DIAGNOSIS — Z1331 Encounter for screening for depression: Secondary | ICD-10-CM | POA: Diagnosis not present

## 2018-06-08 ENCOUNTER — Ambulatory Visit
Admission: RE | Admit: 2018-06-08 | Discharge: 2018-06-08 | Disposition: A | Discharge status: Home / Self Care | Payer: Medicare Other | Source: Ambulatory Visit | Attending: Endocrinology | Admitting: Endocrinology

## 2018-06-08 DIAGNOSIS — E049 Nontoxic goiter, unspecified: Secondary | ICD-10-CM

## 2018-06-08 DIAGNOSIS — E041 Nontoxic single thyroid nodule: Secondary | ICD-10-CM | POA: Diagnosis not present

## 2018-06-13 ENCOUNTER — Ambulatory Visit (HOSPITAL_COMMUNITY)
Admission: RE | Admit: 2018-06-13 | Discharge: 2018-06-13 | Disposition: A | Discharge status: Home / Self Care | Payer: Medicare Other | Source: Ambulatory Visit | Attending: Endocrinology | Admitting: Endocrinology

## 2018-06-13 ENCOUNTER — Encounter (HOSPITAL_COMMUNITY)
Admission: RE | Admit: 2018-06-13 | Discharge: 2018-06-13 | Disposition: A | Discharge status: Home / Self Care | Payer: Medicare Other | Source: Ambulatory Visit | Attending: Endocrinology | Admitting: Endocrinology

## 2018-06-13 ENCOUNTER — Other Ambulatory Visit: Payer: Self-pay

## 2018-06-13 DIAGNOSIS — E210 Primary hyperparathyroidism: Secondary | ICD-10-CM | POA: Diagnosis not present

## 2018-06-13 DIAGNOSIS — E213 Hyperparathyroidism, unspecified: Secondary | ICD-10-CM | POA: Diagnosis not present

## 2018-06-13 MED ORDER — TECHNETIUM TC 99M SESTAMIBI GENERIC - CARDIOLITE
25.0000 | Freq: Once | INTRAVENOUS | Status: AC | PRN
  Administered 2018-06-13: 25 via INTRAVENOUS

## 2018-06-14 ENCOUNTER — Encounter (HOSPITAL_COMMUNITY): Payer: Medicare Other

## 2018-06-19 ENCOUNTER — Ambulatory Visit (HOSPITAL_COMMUNITY): Payer: Medicare Other

## 2018-06-19 ENCOUNTER — Other Ambulatory Visit (HOSPITAL_COMMUNITY): Payer: Self-pay

## 2018-06-25 ENCOUNTER — Other Ambulatory Visit: Payer: Self-pay | Admitting: Cardiology

## 2018-07-11 DIAGNOSIS — E559 Vitamin D deficiency, unspecified: Secondary | ICD-10-CM | POA: Diagnosis not present

## 2018-07-11 DIAGNOSIS — E210 Primary hyperparathyroidism: Secondary | ICD-10-CM | POA: Diagnosis not present

## 2018-07-17 ENCOUNTER — Other Ambulatory Visit: Payer: Self-pay | Admitting: Surgery

## 2018-07-17 DIAGNOSIS — E210 Primary hyperparathyroidism: Secondary | ICD-10-CM

## 2018-07-31 ENCOUNTER — Other Ambulatory Visit: Payer: Self-pay

## 2018-07-31 MED ORDER — METOPROLOL SUCCINATE ER 25 MG PO TB24: 12.5000 mg | ORAL_TABLET | Freq: Every day | ORAL | 3 refills | Status: DC

## 2018-07-31 NOTE — Telephone Encounter (Signed)
Refilled lopressor

## 2018-08-28 ENCOUNTER — Ambulatory Visit
Admission: RE | Admit: 2018-08-28 | Discharge: 2018-08-28 | Disposition: A | Discharge status: Home / Self Care | Payer: Medicare Other | Source: Ambulatory Visit | Attending: Surgery | Admitting: Surgery

## 2018-08-28 ENCOUNTER — Telehealth: Payer: Self-pay | Admitting: Cardiology

## 2018-08-28 ENCOUNTER — Other Ambulatory Visit: Payer: Self-pay

## 2018-08-28 DIAGNOSIS — E210 Primary hyperparathyroidism: Secondary | ICD-10-CM

## 2018-08-28 DIAGNOSIS — E0789 Other specified disorders of thyroid: Secondary | ICD-10-CM | POA: Diagnosis not present

## 2018-08-28 IMAGING — CT CT NECK SOFT TISSUE WO/W CM
1 series · 12 of 14 positions shown, 15 images · IV contrast (APPLIED)
Comparison: Nuclear medicine parathyroid scan 06/13/2018 and
earlier.

CLINICAL DATA: 63-year-old male with possible primary
hyperparathyroidism.

Creatinine was obtained on site at [HOSPITAL] at [HOSPITAL].
Results: Creatinine 1.2 mg/dL.
EXAM:
CT NECK WITH AND WITHOUT CONTRAST
TECHNIQUE: Multidetector CT imaging of the neck was performed without and with
intravenous contrast.
CONTRAST:  75mL [id number] IOPAMIDOL ([id number]-UDD) INJECTION 61%

[Series 4: (id) · axial · 0.38mm/px · z∈[-330,-135]mm · 12 of 231 slices shown, 15 images]
[im 18/231  soft-tissue]
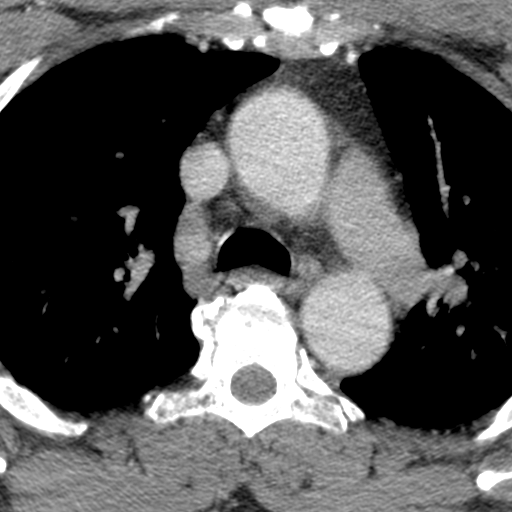
[im 18/231  bone]
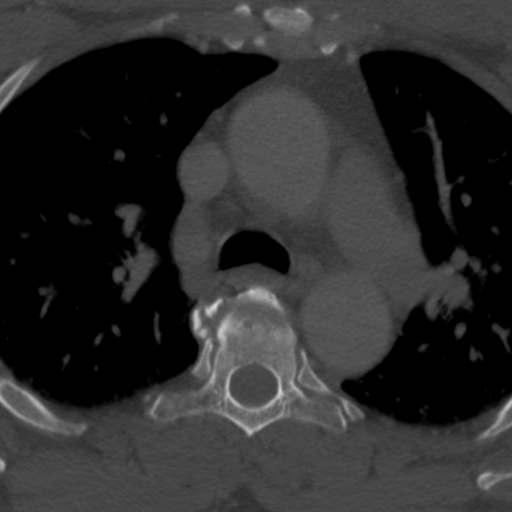
[im 36/231  bone]
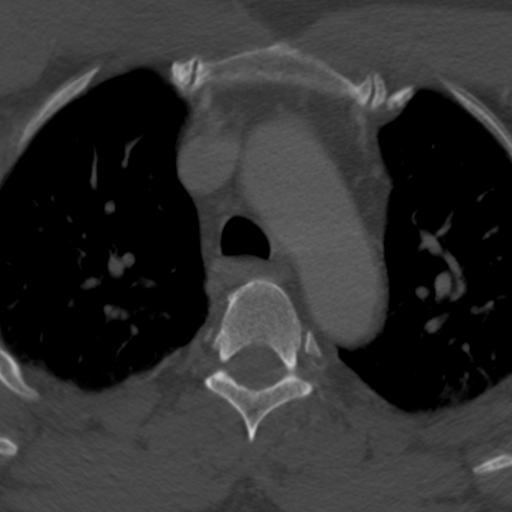
[im 54/231  bone]
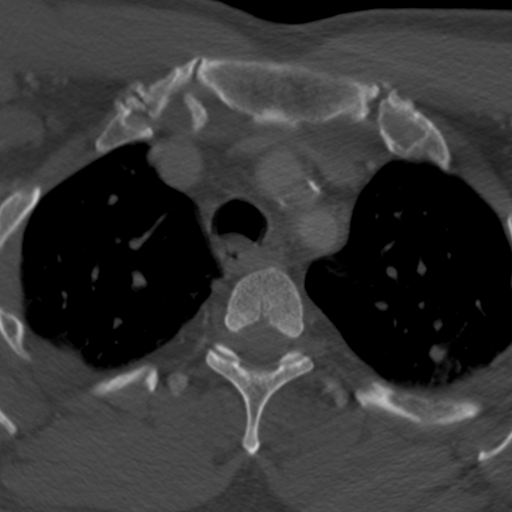
[im 71/231  bone]
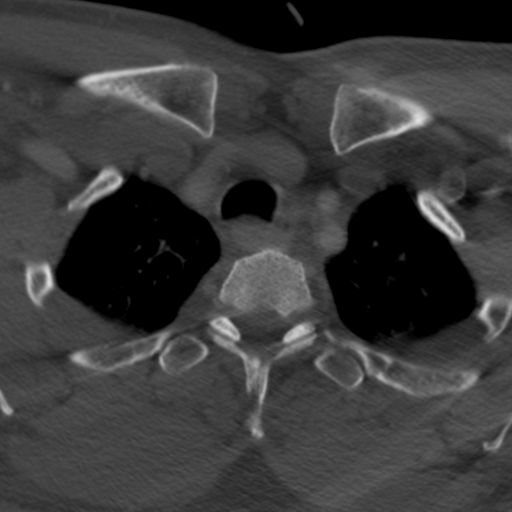
[im 89/231  soft-tissue]
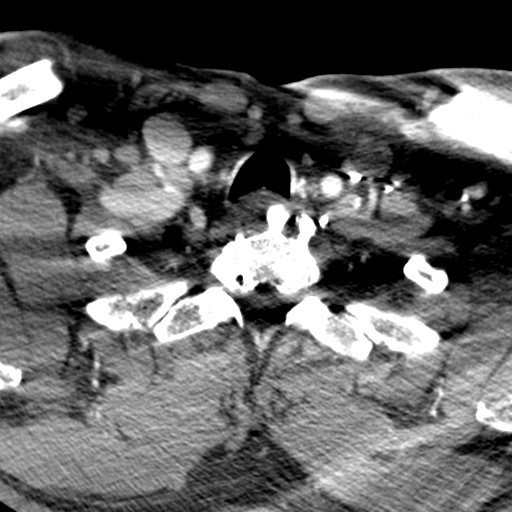
[im 89/231  bone]
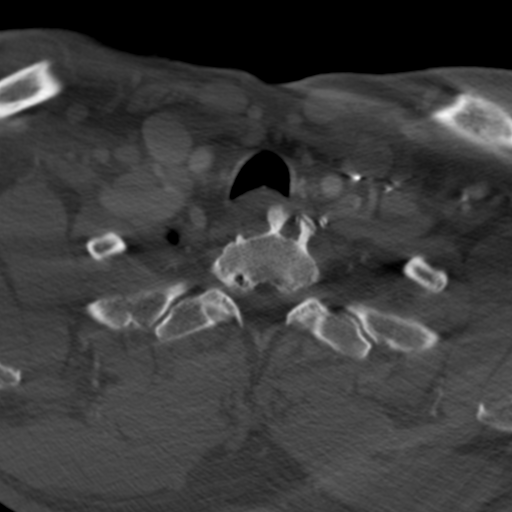
[im 107/231  bone]
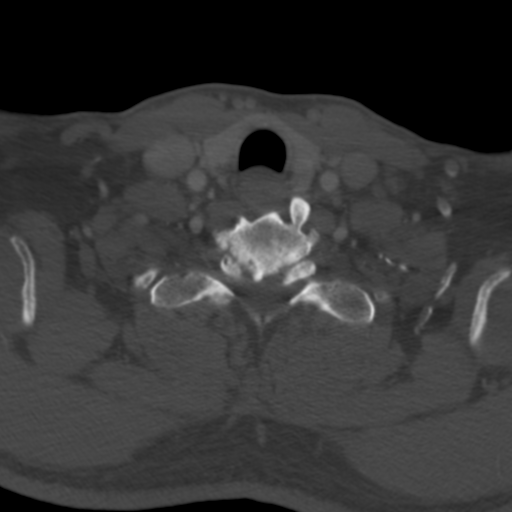
[im 124/231  bone]
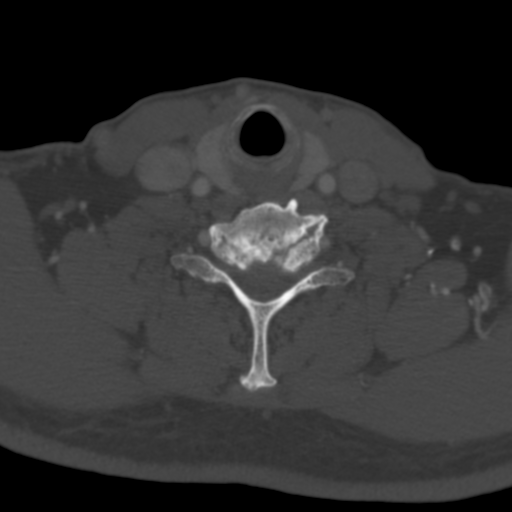
[im 142/231  bone]
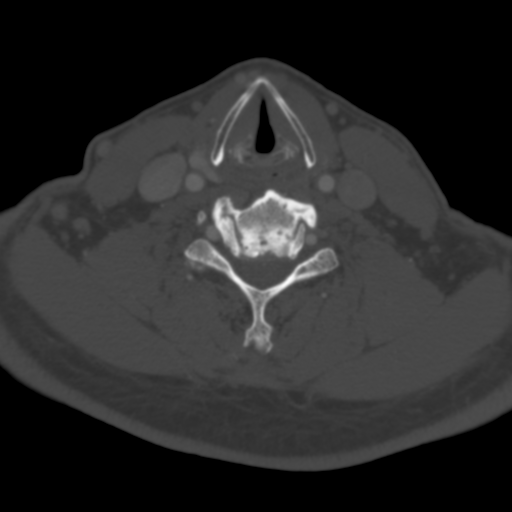
[im 160/231  soft-tissue]
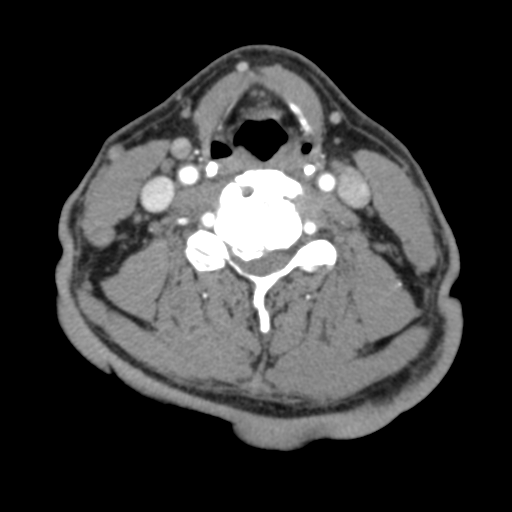
[im 160/231  bone]
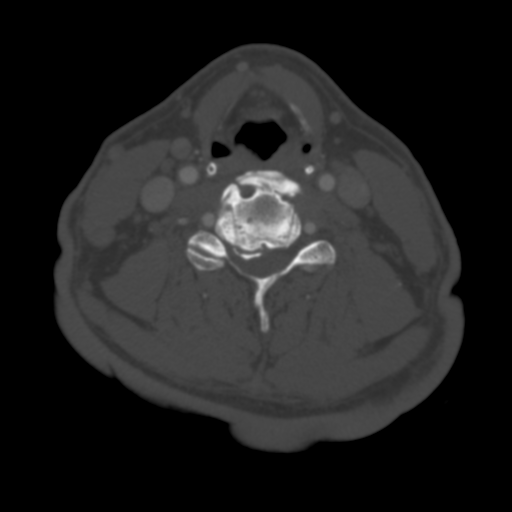
[im 177/231  bone]
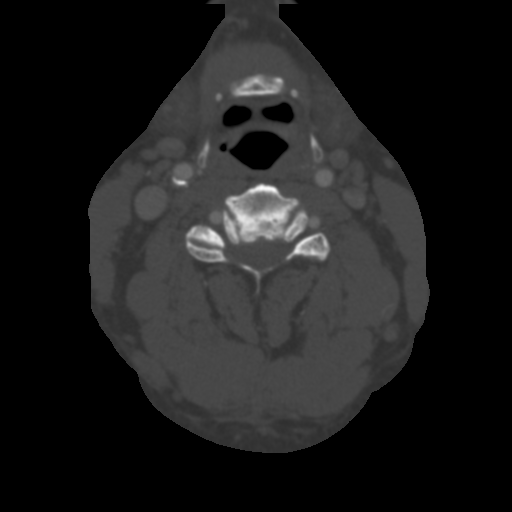
[im 195/231  bone]
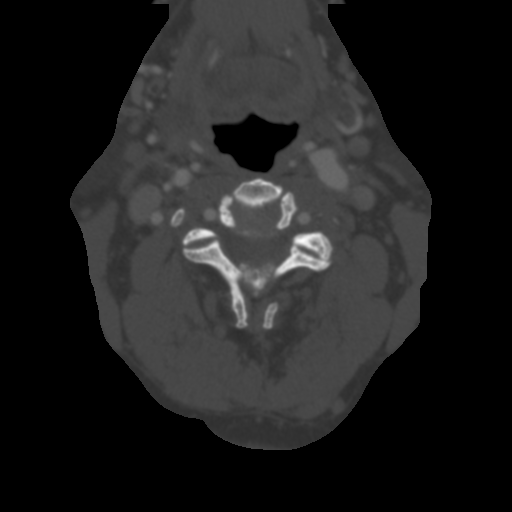
[im 213/231  bone]
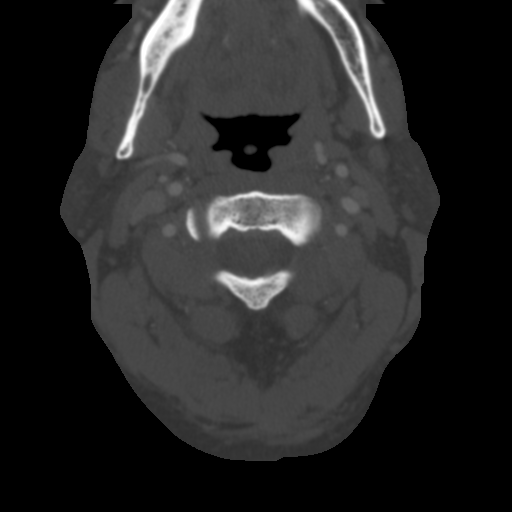

[12 of 14 positions shown; findings below may reference images not displayed]

FINDINGS: Pharynx and larynx: Laryngeal and pharyngeal soft tissues appear
normal. Negative parapharyngeal and retropharyngeal spaces.

Salivary glands: Negative visible sublingual space, submandibular
glands, and parotid glands.

Thyroid: Normal thyroid aside from a subtle subcentimeter hypodense
nodule in the right lobe (series 5, image 122). Probable parathyroid
tissue visible bilaterally on series 4, image 140, and slightly
larger on the left, but without an appearance typical of parathyroid
adenoma.

Lymph nodes: Bilateral cervical lymph node stations appear symmetric
and within normal limits. No lymphadenopathy.

Vascular: The right carotid bifurcates somewhat early with soft and
calcified plaque. Minimal left carotid plaque in the neck. The major
vascular structures in the neck appear patent.

Skeleton: Cervical spine degeneration with bulky endplate
osteophytosis throughout. Multilevel degenerative cervical spinal
stenosis suspected. No acute or suspicious osseous lesion.

Upper chest: Negative lung apices. The mediastinum is most included
on series 4. Calcified aortic atherosclerosis. No superior
mediastinal lymphadenopathy or soft tissue nodule.
IMPRESSION: 1. No parathyroid adenoma identified by multiphase CT.
Small foci of parathyroid tissue suspected on series 4, image 140,
and larger on the left, but lacks the typical morphology of adenoma.
2. Cervical spine degeneration with spinal stenosis and probable
Diffuse idiopathic skeletal hyperostosis (DISH).

## 2018-08-28 MED ORDER — IOPAMIDOL (ISOVUE-300) INJECTION 61%
75.0000 mL | Freq: Once | INTRAVENOUS | Status: AC | PRN
Start: 2018-08-28 — End: 2018-08-28
  Administered 2018-08-28: 75 mL via INTRAVENOUS

## 2018-08-28 NOTE — Telephone Encounter (Signed)
Virtual Visit Pre-Appointment Phone Call  "(Name), I am calling you today to discuss your upcoming appointment. We are currently trying to limit exposure to the virus that causes COVID-19 by seeing patients at home rather than in the office."  "What is the BEST phone number to call the day of the visit?" - [phone] 1. Do you have or have access to (through a family member/friend) a smartphone with video capability that we can use for your visit?" a. If yes - list this number in appt notes as cell (if different from BEST phone #) and list the appointment type as a VIDEO visit in appointment notes b. If no - list the appointment type as a PHONE visit in appointment notes  2. Confirm consent - "In the setting of the current Covid19 crisis, you are scheduled for a (phone or video) visit with your provider on (date) at (time).  Just as we do with many in-office visits, in order for you to participate in this visit, we must obtain consent.  If you'd like, I can send this to your mychart (if signed up) or email for you to review.  Otherwise, I can obtain your verbal consent now.  All virtual visits are billed to your insurance company just like a normal visit would be.  By agreeing to a virtual visit, we'd like you to understand that the technology does not allow for your provider to perform an examination, and thus may limit your provider's ability to fully assess your condition. If your provider identifies any concerns that need to be evaluated in person, we will make arrangements to do so.  Finally, though the technology is pretty good, we cannot assure that it will always work on either your or our end, and in the setting of a video visit, we may have to convert it to a phone-only visit.  In either situation, we cannot ensure that we have a secure connection.  Are you willing to proceed?" STAFF: Did the patient verbally acknowledge consent to telehealth visit? Document YES/NO here: yes    3. Advise patient to be prepared - "Two hours prior to your appointment, go ahead and check your blood pressure, pulse, oxygen saturation, and your weight (if you have the equipment to check those) and write them all down. When your visit starts, your provider will ask you for this information. If you have an Apple Watch or Kardia device, please plan to have heart rate information ready on the day of your appointment. Please have a pen and paper handy nearby the day of the visit as well."  4. Give patient instructions for MyChart download to smartphone OR [email or web address] as below if video visit (depending on what platform provider is using)  5. Inform patient they will receive a phone call 15 minutes prior to their appointment time (may be from unknown caller ID) so they should be prepared to answer    Ricky Wilson has been deemed a candidate for a follow-up tele-health visit to limit community exposure during the Covid-19 pandemic. I spoke with the patient via phone to ensure availability of phone/video source, confirm preferred email & phone number, and discuss instructions and expectations.  I reminded Ricky Wilson to be prepared with any vital sign and/or heart rhythm information that could potentially be obtained via home monitoring, at the time of his visit. I reminded Ricky Wilson to expect a phone call prior to his visit.  Vicky T  Slaughter 08/28/2018 1:14 PM   INSTRUCTIONS FOR DOWNLOADING THE MYCHART APP TO SMARTPHONE  - The patient must first make sure to have activated MyChart and know their login information - If Apple, go to [employer] and type in MyChart in the search bar and download the app. If Android, ask patient to go to [employer] and type in [place] in the search bar and download the app. The app is free but as with any other app downloads, their phone may require them to verify saved payment information or Apple/Android  password.  - The patient will need to then log into the app with their MyChart username and password, and select [facility] as their healthcare provider to link the account. When it is time for your visit, go to the MyChart app, find appointments, and click Begin Video Visit. Be sure to Select Allow for your device to access the Microphone and Camera for your visit. You will then be connected, and your provider will be with you shortly.  **If they have any issues connecting, or need assistance please contact MyChart service desk [phone])**  **If using a computer, in order to ensure the best quality for their visit they will need to use either of the following Internet Browsers: [employer], or Google Chrome**  IF USING DOXIMITY or DOXY.ME - The patient will receive a link just prior to their visit by text.     FULL LENGTH CONSENT FOR TELE-HEALTH VISIT   I hereby voluntarily request, consent and authorize [facility] and its employed or contracted physicians, physician assistants, nurse practitioners or other licensed health care professionals (the Practitioner), to provide me with telemedicine health care services (the Services") as deemed necessary by the treating Practitioner. I acknowledge and consent to receive the Services by the Practitioner via telemedicine. I understand that the telemedicine visit will involve communicating with the Practitioner through live audiovisual communication technology and the disclosure of certain medical information by electronic transmission. I acknowledge that I have been given the opportunity to request an in-person assessment or other available alternative prior to the telemedicine visit and am voluntarily participating in the telemedicine visit.  I understand that I have the right to withhold or withdraw my consent to the use of telemedicine in the course of my care at any time, without affecting my right to future care or treatment,  and that the Practitioner or I may terminate the telemedicine visit at any time. I understand that I have the right to inspect all information obtained and/or recorded in the course of the telemedicine visit and may receive copies of available information for a reasonable fee.  I understand that some of the potential risks of receiving the Services via telemedicine include:   Delay or interruption in medical evaluation due to technological equipment failure or disruption;  Information transmitted may not be sufficient (e.g. poor resolution of images) to allow for appropriate medical decision making by the Practitioner; and/or   In rare instances, security protocols could fail, causing a breach of personal health information.  Furthermore, I acknowledge that it is my responsibility to provide information about my medical history, conditions and care that is complete and accurate to the best of my ability. I acknowledge that Practitioner's advice, recommendations, and/or decision may be based on factors not within their control, such as incomplete or inaccurate data provided by me or distortions of diagnostic images or specimens that may result from electronic transmissions. I understand that the practice of  medicine is not an Chief Strategy Officer and that Practitioner makes no warranties or guarantees regarding treatment outcomes. I acknowledge that I will receive a copy of this consent concurrently upon execution via email to the email address I last provided but may also request a printed copy by calling the office of [facility].    I understand that my insurance will be billed for this visit.   I have read or had this consent read to me.  I understand the contents of this consent, which adequately explains the benefits and risks of the Services being provided via telemedicine.   I have been provided ample opportunity to ask questions regarding this consent and the Services and have had my questions  answered to my satisfaction.  I give my informed consent for the services to be provided through the use of telemedicine in my medical care  By participating in this telemedicine visit I agree to the above.

## 2018-08-30 ENCOUNTER — Telehealth (INDEPENDENT_AMBULATORY_CARE_PROVIDER_SITE_OTHER): Payer: Medicare Other | Admitting: Cardiology

## 2018-08-30 ENCOUNTER — Encounter: Payer: Self-pay | Admitting: *Deleted

## 2018-08-30 VITALS — BP 121/65 | HR 61 | Ht 71.0 in | Wt 216.0 lb

## 2018-08-30 DIAGNOSIS — I10 Essential (primary) hypertension: Secondary | ICD-10-CM

## 2018-08-30 DIAGNOSIS — Z7189 Other specified counseling: Secondary | ICD-10-CM | POA: Diagnosis not present

## 2018-08-30 DIAGNOSIS — I25119 Atherosclerotic heart disease of native coronary artery with unspecified angina pectoris: Secondary | ICD-10-CM | POA: Diagnosis not present

## 2018-08-30 DIAGNOSIS — Z79899 Other long term (current) drug therapy: Secondary | ICD-10-CM

## 2018-08-30 DIAGNOSIS — E782 Mixed hyperlipidemia: Secondary | ICD-10-CM | POA: Diagnosis not present

## 2018-08-30 MED ORDER — ATORVASTATIN CALCIUM 20 MG PO TABS: 20.0000 mg | ORAL_TABLET | Freq: Every day | ORAL | 3 refills | Status: DC

## 2018-08-30 NOTE — Patient Instructions (Addendum)
Medication Instructions:   Your physician has recommended you make the following change in your medication:   Finish your current supply of crestor, then switch to atorvastatin 20 mg by mouth daily  Continue all other medications the same  Labwork:  Your physician recommends that you return for a FASTING lipid/liver profile in 8-12 weeks. Please make sure you are fasting for at least 8 hours when you have this done. Please have this done at [facility]. Your lab order has been faxed to their lab.  Testing/Procedures:  NONE  Follow-Up:  Your physician recommends that you schedule a follow-up appointment in: 6 months. You will receive a reminder letter in the mail in about 4 months reminding you to call and schedule your appointment. If you don't receive this letter, please contact our office.  Any Other Special Instructions Will Be Listed Below (If Applicable).  If you need a refill on your cardiac medications before your next appointment, please call your pharmacy.

## 2018-08-30 NOTE — Progress Notes (Signed)
Virtual Visit via Video Note   This visit type was conducted due to national recommendations for restrictions regarding the COVID-19 Pandemic (e.g. social distancing) in an effort to limit this patient's exposure and mitigate transmission in our community.  Due to his co-morbid illnesses, this patient is at least at moderate risk for complications without adequate follow up.  This format is felt to be most appropriate for this patient at this time.  All issues noted in this document were discussed and addressed.  A limited physical exam was performed with this format.  Please refer to the patient's chart for his consent to telehealth for [facility].   Date:  08/30/2018   ID:  Coralee North, [place] 09/05/1955, MRN [id number]  Patient Location: Home Provider Location: Office  PCP:  Glenda Chroman, MD  Cardiologist:  Rozann Lesches, MD Electrophysiologist:  None   Evaluation Performed:  Follow-Up Visit  Chief Complaint:   Cardiac follow-up  History of Present Illness:    Ricky Wilson is a 63 y.o. male last seen in November 2019.  We communicated by video conferencing today.  He does not report any active angina symptoms at this time on medical therapy.  He does admit that his activity level has dropped off significantly, he was going to the gym prior to the pandemic.  He has gained some weight.  I reviewed his medications with are outlined below.  We did talk about a potential switch from Crestor to atorvastatin which is better covered by his insurance.  He tells me that he is also in the process of undergoing evaluation of hypercalcemia with parathyroid assessment.  He anticipates surgery eventually.  Last ischemic testing was in 2017 as outlined below.  The patient does not have symptoms concerning for COVID-19 infection (fever, chills, cough, or new shortness of breath).    Past Medical History:  Diagnosis Date  . Arthritis   . Carpal tunnel syndrome, bilateral   .  Coronary atherosclerosis of native coronary artery    Occluded RCA with collaterals 2004  . Gout   . Hypertension   . Mixed hyperlipidemia    Past Surgical History:  Procedure Laterality Date  . CARPAL TUNNEL RELEASE  03/31/2011   Procedure: CARPAL TUNNEL RELEASE;  Surgeon: Cammie Sickle., MD;  Location: [place];  Service: Orthopedics;  Laterality: Left;  . CARPAL TUNNEL RELEASE  05/17/2011   Procedure: CARPAL TUNNEL RELEASE;  Surgeon: Cammie Sickle., MD;  Location: [place];  Service: Orthopedics;  Laterality: Right;  . ELBOW SURGERY  2008   right  . tooth extract     and bone grafts  . TORN LEFT MEDIAL MENISCUS  2008  . TOTAL SHOULDER ARTHROPLASTY Left 03/02/2017   Procedure: LEFT TOTAL SHOULDER ARTHROPLASTY;  Surgeon: Justice Britain, MD;  Location: [place];  Service: Orthopedics;  Laterality: Left;  . TRIGGER FINGER RELEASE Bilateral    thumbs  . VASECTOMY  1977     Current Meds  Medication Sig  . aspirin EC 81 MG tablet Take 81 mg by mouth daily.  . benazepril (LOTENSIN) 10 MG tablet TAKE 1 TABLET (10 MG TOTAL) BY MOUTH EVERY EVENING.  . diclofenac sodium (VOLTAREN) 1 % GEL Voltaren Gel 3 grams to 3 large joints upto TID 3 TUBES with 3 refills (Patient taking differently: Apply 4 g topically as needed (SHOULDER PAIN). )  . diphenhydrAMINE (BENADRYL) 25 MG tablet Take 25 mg by mouth as needed.  Marland Kitchen  metoprolol succinate (TOPROL XL) 25 MG 24 hr tablet Take 0.5 tablets (12.5 mg total) by mouth daily.  . Multiple Vitamin (MULTIVITAMIN) tablet Take 1 tablet by mouth daily.    . Omega-3 Fatty Acids (FISH OIL) 1000 MG CAPS Take 1 capsule (1,000 mg total) by mouth 2 (two) times daily.  . [DISCONTINUED] rosuvastatin (CRESTOR) 10 MG tablet Take 10 mg by mouth every evening.      Allergies:   Hydrocodone   Social History   Tobacco Use  . Smoking status: Never Smoker  . Smokeless tobacco: Never Used  Substance Use Topics  . Alcohol use: Yes     Alcohol/week: 0.0 standard drinks    Frequency: Never    Comment: rarely  . Drug use: No     Family Hx: The patient's family history includes Coronary artery disease in an other family member.  ROS:   Please see the history of present illness. All other systems reviewed and are negative.   Prior CV studies:   The following studies were reviewed today:  Lexiscan Myoview 01/21/2016:  No diagnostic ST segment changes to indicate ischemia.  No significant myocardial perfusion defects to indicate scar or ischemia.  This is a low risk study.  Nuclear stress EF: 58%.  Labs/Other Tests and Data Reviewed:    EKG:  An ECG dated 02/07/2018 was personally reviewed today and demonstrated:  Sinus rhythm with IVCD.  Recent Labs:  July 2019: Hemoglobin 14.1, platelets 186, BUN 15, creatinine 1.13, potassium 4.9, AST 25, ALT 29, cholesterol 125, triglycerides 116, HDL 47, LDL 55, TSH 2.48  Wt Readings from Last 3 Encounters:  08/30/18 216 lb (98 kg)  02/07/18 216 lb 9.6 oz (98.2 kg)  07/28/17 208 lb (94.3 kg)     Objective:    Vital Signs:  BP 121/65   Pulse 61   Ht 5\' 11"  (1.803 m)   Wt 216 lb (98 kg)   BMI 30.13 kg/m    General: Patient appears comfortable at rest. HEENT: Conjunctiva and lids normal. Lungs: Patient spoke in full sentences, no shortness of breath.  No audible wheezing or coughing. Skin: Normal appearance of color and turgor. Neuropsychiatric: Gaze conjugate.  Speech pattern normal.  Moves all extremities.  Affect appropriate.  ASSESSMENT & PLAN:    1.  CAD with known occlusion of the RCA associated with collaterals.  He reports no progressive angina on medical therapy and we will continue with observation.  I did encourage him to get back to a regular exercise plan.  2.  Mixed hyperlipidemia.  We will switch from Crestor to atorvastatin 20 mg daily, better insurance coverage per patient.  Reassess FLP and LFTs in 8 to 12 weeks.  3.  Essential  hypertension, blood pressure reported today is in good range.  No changes made.  COVID-19 Education: The signs and symptoms of COVID-19 were discussed with the patient and how to seek care for testing (follow up with PCP or arrange E-visit).  The importance of social distancing was discussed today.  Time:   Today, I have spent 8 minutes with the patient with telehealth technology discussing the above problems.     Medication Adjustments/Labs and Tests Ordered: Current medicines are reviewed at length with the patient today.  Concerns regarding medicines are outlined above.   Tests Ordered: Orders Placed This Encounter  Procedures  . Hepatic function panel  . Lipid panel    Medication Changes: Meds ordered this encounter  Medications  . atorvastatin (LIPITOR) 20  MG tablet    Sig: Take 1 tablet (20 mg total) by mouth daily.    Dispense:  90 tablet    Refill:  3    08/30/2018 NEW-stop crestor after current supply finishes    Disposition:  Follow up 6 months in the [place] office.  Signed, Rozann Lesches, MD  08/30/2018 9:01 AM    [place]

## 2018-09-04 DIAGNOSIS — Z299 Encounter for prophylactic measures, unspecified: Secondary | ICD-10-CM | POA: Diagnosis not present

## 2018-09-04 DIAGNOSIS — Z713 Dietary counseling and surveillance: Secondary | ICD-10-CM | POA: Diagnosis not present

## 2018-09-04 DIAGNOSIS — R6884 Jaw pain: Secondary | ICD-10-CM | POA: Diagnosis not present

## 2018-09-04 DIAGNOSIS — Z6829 Body mass index (BMI) 29.0-29.9, adult: Secondary | ICD-10-CM | POA: Diagnosis not present

## 2018-09-04 DIAGNOSIS — I10 Essential (primary) hypertension: Secondary | ICD-10-CM | POA: Diagnosis not present

## 2018-09-10 ENCOUNTER — Ambulatory Visit: Payer: Self-pay | Admitting: Surgery

## 2018-09-10 DIAGNOSIS — E210 Primary hyperparathyroidism: Secondary | ICD-10-CM | POA: Diagnosis not present

## 2018-10-05 DIAGNOSIS — E039 Hypothyroidism, unspecified: Secondary | ICD-10-CM | POA: Diagnosis not present

## 2018-10-05 DIAGNOSIS — I10 Essential (primary) hypertension: Secondary | ICD-10-CM | POA: Diagnosis not present

## 2018-10-05 DIAGNOSIS — E7800 Pure hypercholesterolemia, unspecified: Secondary | ICD-10-CM | POA: Diagnosis not present

## 2018-10-05 DIAGNOSIS — E210 Primary hyperparathyroidism: Secondary | ICD-10-CM | POA: Diagnosis not present

## 2018-10-05 DIAGNOSIS — Z299 Encounter for prophylactic measures, unspecified: Secondary | ICD-10-CM | POA: Diagnosis not present

## 2018-10-05 DIAGNOSIS — Z6829 Body mass index (BMI) 29.0-29.9, adult: Secondary | ICD-10-CM | POA: Diagnosis not present

## 2018-10-27 ENCOUNTER — Encounter (HOSPITAL_COMMUNITY): Payer: Self-pay | Admitting: Surgery

## 2018-10-27 DIAGNOSIS — E210 Primary hyperparathyroidism: Secondary | ICD-10-CM | POA: Diagnosis present

## 2018-10-27 NOTE — H&P (Signed)
General Surgery [facility]  Ricky Wilson DOB: [birth year] Married / Language: English / Race: White Male   History of Present Illness  The patient is a 63 year old male who presents with primary hyperparathyroidism.  CHIEF COMPLAINT: primary hyperparathyroidism  Patient returns for follow-up and to review the results of his diagnostic testing. Patient had undergone 2 previous nuclear medicine scans which failed to identify a parathyroid adenoma. At my request he underwent an ultrasound examination which demonstrated a 1.2 cm hypoechoic nodule behind the left thyroid lobe which was suspicious for parathyroid adenoma. 24-hour urine collection was elevated at 428. Patient subsequently underwent a 4D CT scan of the neck on August 28, 2018. This demonstrated a nodule high in the left thyroid lobe which was felt to be parathyroid tissue although it was not felt to be typical of parathyroid adenoma. The patient and I reviewed all of these studies today.   Problem List/Past Medical  VITAMIN D INSUFFICIENCY (E55.9)  PRIMARY HYPERPARATHYROIDISM (E21.0)   Past Surgical History  Knee Surgery  Left. Oral Surgery  Shoulder Surgery  Left. Vasectomy   Diagnostic Studies History Colonoscopy  within last year  Allergies HYDROcodone Bitartrate ER *ANALGESICS - OPIOID*  Allergies Reconciled   Medication History Benazepril HCl (10MG  Tablet, Oral) Active. Rosuvastatin Calcium (10MG  Tablet, Oral) Active. Metoprolol Succinate ER (25MG  Tablet ER 24HR, Oral) Active. Aspirin (81MG  Tablet, Oral) Active. Tadalafil (20MG  Tablet, Oral) Active. Medications Reconciled  Social History Alcohol use  Occasional alcohol use. Caffeine use  Coffee, Tea. No drug use  Tobacco use  Never smoker.  Family History Diabetes Mellitus  Family Members In General. Heart Disease  Brother, Father, Mother. Heart disease in male family member before age 35  Heart disease  in male family member before age 40  Hypertension  Brother, Daughter, Father, Sister.  Other Problems Arthritis  Gastroesophageal Reflux Disease  High blood pressure  Hypercholesterolemia   Vitals  Weight: 219.4 lb Height: 71in Body Surface Area: 2.19 m Body Mass Index: 30.6 kg/m  Temp.: 98.46F  Pulse: 99 (Regular)  Resp.: 72 (Unlabored)  BP: 143/82(Sitting, Left Arm, Standard)  Physical Exam  See vital signs recorded above  GENERAL APPEARANCE Development: normal Nutritional status: normal Gross deformities: none  SKIN Rash, lesions, ulcers: none Induration, erythema: none Nodules: none palpable  EYES Conjunctiva and lids: normal Pupils: equal and reactive Iris: normal bilaterally  EARS, NOSE, MOUTH, THROAT External ears: no lesion or deformity External nose: no lesion or deformity Hearing: grossly normal Lips: no lesion or deformity Dentition: normal for age Oral mucosa: moist  NECK Symmetric: yes Trachea: midline Thyroid: no palpable nodules in the thyroid bed  CHEST Respiratory effort: normal Retraction or accessory muscle use: no Breath sounds: normal bilaterally Rales, rhonchi, wheeze: none  CARDIOVASCULAR Auscultation: regular rhythm, normal rate Murmurs: none Pulses: carotid and radial pulse 2+ palpable Lower extremity edema: none Lower extremity varicosities: none  MUSCULOSKELETAL Station and gait: normal Digits and nails: no clubbing or cyanosis Muscle strength: grossly normal all extremities Range of motion: grossly normal all extremities Deformity: none  LYMPHATIC Cervical: none palpable Supraclavicular: none palpable  PSYCHIATRIC Oriented to person, place, and time: yes Mood and affect: normal for situation Judgment and insight: appropriate for situation    Assessment & Plan   PRIMARY HYPERPARATHYROIDISM (E21.0)   The patient returns today for follow-up to review the results of his diagnostic studies  and to discuss further management. We reviewed the results of his nuclear medicine scans, his  ultrasound scan, and his 4D CT scan. We also reviewed his laboratory studies.  Based on these studies, I think there is a high likelihood that he has a left-sided parathyroid adenoma. I have recommended proceeding to surgery for neck exploration beginning on the left side as a minimally invasive procedure with the idea that this will be performed as an outpatient. We will submit the tissue to pathology immediately for frozen section biopsy. If this confirms parathyroid adenoma, we will conclude the procedure and the patient may go home as an outpatient surgery. If the pathologist does not confirm parathyroid adenoma, then we will extend the incision slightly and perform a traditional 4 gland exploration with a subsequent overnight hospital stay. Patient understands the strategy and wishes to proceed with surgery in the near future.  The risks and benefits of the procedure have been discussed at length with the patient. The patient understands the proposed procedure, potential alternative treatments, and the course of recovery to be expected. All of the patient's questions have been answered at this time. The patient wishes to proceed with surgery.  Armandina Gemma, [place] [facility] Office: [phone]

## 2018-10-29 ENCOUNTER — Inpatient Hospital Stay (HOSPITAL_COMMUNITY): Admission: RE | Admit: 2018-10-29 | Payer: Medicare Other | Source: Ambulatory Visit

## 2018-10-29 LAB — SARS CORONAVIRUS 2 (TAT 6-24 HRS): SARS Coronavirus 2: NEGATIVE

## 2018-10-29 NOTE — Progress Notes (Signed)
EKG 02-07-18 Epic LOV CARDIOLOGY DR MCDOWELL 08-30-18 EPIC

## 2018-10-29 NOTE — Patient Instructions (Addendum)
YOU HAD  A COVID 19 TEST ON 10-29-2018. ONCE YOUR COVID TEST IS COMPLETED, PLEASE BEGIN THE QUARANTINE INSTRUCTIONS AS OUTLINED IN YOUR HANDOUT.                [facility]    Your procedure is scheduled on: 11-01-2018    Report to [facility] Main  Entrance    Report to [place] AT 5:30 AM.    1 VISITOR IS ALLOWED TO WAIT IN WAITING ROOM  ONLY DAY OF YOUR SURGERY.    Call this number if you have problems the morning of surgery [phone]    Remember: Do not eat food or drink liquids :After Midnight.    Take these medicines the morning of surgery with A SIP OF WATER: Metoprolol Succinate, Atorvastatin (Lipitor), Famotidine (Pepcid)   BRUSH YOUR TEETH MORNING OF SURGERY AND RINSE YOUR MOUTH OUT, NO CHEWING GUM CANDY OR MINTS.                                You may not have any metal on your body including hair pins and              piercings     Do not wear jewelry,cologne, lotions, powders or deodorant              Men may shave face and neck.   Do not bring valuables to the hospital. [place].  Contacts, dentures or bridgework may not be worn into surgery.      _____________________________________________________________________             [facility] - Preparing for Surgery Before surgery, you can play an important role.  Because skin is not sterile, your skin needs to be as free of germs as possible.  You can reduce the number of germs on your skin by washing with CHG (chlorahexidine gluconate) soap before surgery.  CHG is an antiseptic cleaner which kills germs and bonds with the skin to continue killing germs even after washing. Please DO NOT use if you have an allergy to CHG or antibacterial soaps.  If your skin becomes reddened/irritated stop using the CHG and inform your nurse when you arrive at Short Stay. Do not shave (including legs and underarms) for at least 48 hours prior to the first CHG  shower.  You may shave your face/neck. Please follow these instructions carefully:  1.  Shower with CHG Soap the night before surgery and the  morning of Surgery.  2.  If you choose to wash your hair, wash your hair first as usual with your  normal  shampoo.  3.  After you shampoo, rinse your hair and body thoroughly to remove the  shampoo.                           4.  Use CHG as you would any other liquid soap.  You can apply chg directly  to the skin and wash                       Gently with a scrungie or clean washcloth.  5.  Apply the CHG Soap to your body ONLY FROM THE NECK DOWN.   Do not use on face/ open  Wound or open sores. Avoid contact with eyes, ears mouth and genitals (private parts).                       Wash face,  Genitals (private parts) with your normal soap.             6.  Wash thoroughly, paying special attention to the area where your surgery  will be performed.  7.  Thoroughly rinse your body with warm water from the neck down.  8.  DO NOT shower/wash with your normal soap after using and rinsing off  the CHG Soap.                9.  Pat yourself dry with a clean towel.            10.  Wear clean pajamas.            11.  Place clean sheets on your bed the night of your first shower and do not  sleep with pets. Day of Surgery : Do not apply any lotions/deodorants the morning of surgery.  Please wear clean clothes to the hospital/surgery center.  FAILURE TO FOLLOW THESE INSTRUCTIONS MAY RESULT IN THE CANCELLATION OF YOUR SURGERY PATIENT SIGNATURE_________________________________  NURSE SIGNATURE__________________________________  ________________________________________________________________________

## 2018-10-30 ENCOUNTER — Encounter (HOSPITAL_COMMUNITY): Payer: Self-pay

## 2018-10-30 ENCOUNTER — Other Ambulatory Visit: Payer: Self-pay

## 2018-10-30 ENCOUNTER — Encounter (HOSPITAL_COMMUNITY)
Admission: RE | Admit: 2018-10-30 | Discharge: 2018-10-30 | Disposition: A | Discharge status: Home / Self Care | Payer: Medicare Other | Source: Ambulatory Visit | Attending: Surgery | Admitting: Surgery

## 2018-10-30 DIAGNOSIS — I2510 Atherosclerotic heart disease of native coronary artery without angina pectoris: Secondary | ICD-10-CM | POA: Insufficient documentation

## 2018-10-30 DIAGNOSIS — M1990 Unspecified osteoarthritis, unspecified site: Secondary | ICD-10-CM | POA: Insufficient documentation

## 2018-10-30 DIAGNOSIS — E210 Primary hyperparathyroidism: Secondary | ICD-10-CM | POA: Insufficient documentation

## 2018-10-30 DIAGNOSIS — Z20828 Contact with and (suspected) exposure to other viral communicable diseases: Secondary | ICD-10-CM | POA: Insufficient documentation

## 2018-10-30 DIAGNOSIS — Z7982 Long term (current) use of aspirin: Secondary | ICD-10-CM | POA: Insufficient documentation

## 2018-10-30 DIAGNOSIS — E7800 Pure hypercholesterolemia, unspecified: Secondary | ICD-10-CM | POA: Diagnosis not present

## 2018-10-30 DIAGNOSIS — Z01818 Encounter for other preprocedural examination: Secondary | ICD-10-CM | POA: Insufficient documentation

## 2018-10-30 DIAGNOSIS — E782 Mixed hyperlipidemia: Secondary | ICD-10-CM | POA: Insufficient documentation

## 2018-10-30 DIAGNOSIS — Z79899 Other long term (current) drug therapy: Secondary | ICD-10-CM | POA: Insufficient documentation

## 2018-10-30 DIAGNOSIS — I10 Essential (primary) hypertension: Secondary | ICD-10-CM | POA: Insufficient documentation

## 2018-10-30 LAB — BASIC METABOLIC PANEL
Anion gap: 6 (ref 5–15)
BUN: 10 mg/dL (ref 8–23)
CO2: 29 mmol/L (ref 22–32)
Calcium: 11.8 mg/dL — ABNORMAL HIGH (ref 8.9–10.3)
Chloride: 104 mmol/L (ref 98–111)
Creatinine, Ser: 1.13 mg/dL (ref 0.61–1.24)
GFR calc Af Amer: >60 mL/min (ref >60)
GFR calc non Af Amer: >60 mL/min (ref >60)
Glucose, Bld: 103 mg/dL — ABNORMAL HIGH (ref 70–99)
Potassium: 4.5 mmol/L (ref 3.5–5.1)
Sodium: 139 mmol/L (ref 135–145)

## 2018-10-30 LAB — CBC
HCT: 43.6 % (ref 39.0–52.0)
Hemoglobin: 14.8 g/dL (ref 13.0–17.0)
MCH: 31 pg (ref 26.0–34.0)
MCHC: 33.9 g/dL (ref 30.0–36.0)
MCV: 91.2 fL (ref 80.0–100.0)
Platelets: 123 10*3/uL — ABNORMAL LOW (ref 150–400)
RBC: 4.78 MIL/uL (ref 4.22–5.81)
RDW: 12.9 % (ref 11.5–15.5)
WBC: 5.3 10*3/uL (ref 4.0–10.5)
nRBC: 0 % (ref 0.0–0.2)

## 2018-10-31 ENCOUNTER — Encounter (HOSPITAL_COMMUNITY): Payer: Self-pay | Admitting: Anesthesiology

## 2018-10-31 NOTE — Anesthesia Preprocedure Evaluation (Addendum)
Anesthesia Evaluation  Patient identified by MRN, date of birth, ID band Patient awake    Reviewed: Allergy & Precautions, NPO status , Patient's Chart, lab work & pertinent test results  Airway Mallampati: I  TM Distance: >3 FB Neck ROM: Full    Dental  (+) Teeth Intact, Dental Advisory Given   Pulmonary neg pulmonary ROS,    breath sounds clear to auscultation       Cardiovascular hypertension, + CAD   Rhythm:Regular Rate:Normal     Neuro/Psych  Neuromuscular disease    GI/Hepatic negative GI ROS, Neg liver ROS,   Endo/Other  negative endocrine ROS  Renal/GU negative Renal ROS     Musculoskeletal  (+) Arthritis ,   Abdominal Normal abdominal exam  (+)   Peds  Hematology negative hematology ROS (+)   Anesthesia Other Findings   Reproductive/Obstetrics                            Anesthesia Physical Anesthesia Plan  ASA: III  Anesthesia Plan: General   Post-op Pain Management:    Induction:   PONV Risk Score and Plan: 3 and Ondansetron, Dexamethasone and Treatment may vary due to age or medical condition  Airway Management Planned: Oral ETT  Additional Equipment: None  Intra-op Plan:   Post-operative Plan: Extubation in OR  Informed Consent: I have reviewed the patients History and Physical, chart, labs and discussed the procedure including the risks, benefits and alternatives for the proposed anesthesia with the patient or authorized representative who has indicated his/her understanding and acceptance.     Dental advisory given  Plan Discussed with: CRNA  Anesthesia Plan Comments:        Anesthesia Quick Evaluation

## 2018-10-31 NOTE — Progress Notes (Signed)
Anesthesia Chart Review   Case: [id number] Date/Time: 11/01/18 0715   Procedure: PARATHYROIDECTOMY (N/A )   Anesthesia type: General   Pre-op diagnosis: PRIMARY HYPERPARATHYROIDISM   [facility] ROOM 05 / [facility]   Surgeon: Armandina Gemma, MD      DISCUSSION:63 y.o. never smoker with h/o HLD, HTN, CAD, primary hyperparathyroidism scheduled for above procedure 11/01/2018 with Dr. Armandina Gemma.   Pt last seen by cardiologist, Dr. Rozann Lesches, 08/30/2018 via telemedicine.  Per OV note CAD stable, no changes made at this visit, 6 month follow up recommended.    Anticipate pt can proceed with planned procedure barring acute status change.   VS: BP (!) 156/83 (BP Location: Left Arm)   Pulse 70   Temp 36.9 C (Oral)   Resp 18   Ht 5\' 11"  (1.803 m)   Wt 101.3 kg   SpO2 99%   BMI 31.15 kg/m   PROVIDERS: Glenda Chroman, MD is PCP   Rozann Lesches, MD is Cardiologist  LABS: Labs reviewed: Acceptable for surgery. (all labs ordered are listed, but only abnormal results are displayed)  Labs Reviewed  BASIC METABOLIC PANEL - Abnormal; Notable for the following components:      Result Value   Glucose, Bld 103 (*)    Calcium 11.8 (*)    All other components within normal limits  CBC - Abnormal; Notable for the following components:   Platelets 123 (*)    All other components within normal limits     IMAGES:   EKG: 02/07/18 Rate 70 bpm Sinus rhythm   CV: Lexiscan Myoview 01/21/2016:  No diagnostic ST segment changes to indicate ischemia.  No significant myocardial perfusion defects to indicate scar or ischemia.  This is a low risk study.  Nuclear stress EF: 58%. Past Medical History:  Diagnosis Date  . Arthritis   . Carpal tunnel syndrome, bilateral   . Coronary atherosclerosis of native coronary artery    Occluded RCA with collaterals 2004  . Gout   . Hypertension   . Mixed hyperlipidemia     Past Surgical History:  Procedure Laterality Date  . CARPAL TUNNEL  RELEASE  03/31/2011   Procedure: CARPAL TUNNEL RELEASE;  Surgeon: Cammie Sickle., MD;  Location: [place];  Service: Orthopedics;  Laterality: Left;  . CARPAL TUNNEL RELEASE  05/17/2011   Procedure: CARPAL TUNNEL RELEASE;  Surgeon: Cammie Sickle., MD;  Location: [place];  Service: Orthopedics;  Laterality: Right;  . ELBOW SURGERY  2008   right  . tooth extract     and bone grafts  . TORN LEFT MEDIAL MENISCUS  2008  . TOTAL SHOULDER ARTHROPLASTY Left 03/02/2017   Procedure: LEFT TOTAL SHOULDER ARTHROPLASTY;  Surgeon: Justice Britain, MD;  Location: [place];  Service: Orthopedics;  Laterality: Left;  . TRIGGER FINGER RELEASE Bilateral    thumbs  . VASECTOMY  1977    MEDICATIONS: . aspirin EC 81 MG tablet  . atorvastatin (LIPITOR) 20 MG tablet  . benazepril (LOTENSIN) 10 MG tablet  . diclofenac sodium (VOLTAREN) 1 % GEL  . famotidine (PEPCID) 20 MG tablet  . metoprolol succinate (TOPROL XL) 25 MG 24 hr tablet  . Multiple Vitamin (MULTIVITAMIN) tablet  . Omega-3 Fatty Acids (FISH OIL) 1000 MG CAPS  . vitamin C (ASCORBIC ACID) 500 MG tablet   No current facility-administered medications for this encounter.      Maia Plan [facility] Pre-Surgical Testing [phone] 10/31/18  3:34  PM

## 2018-11-01 ENCOUNTER — Encounter (HOSPITAL_COMMUNITY): Payer: Self-pay | Admitting: Anesthesiology

## 2018-11-01 ENCOUNTER — Ambulatory Visit (HOSPITAL_COMMUNITY)
Admission: RE | Admit: 2018-11-01 | Discharge: 2018-11-01 | Disposition: A | Discharge status: Home / Self Care | Payer: Medicare Other | Attending: Surgery | Admitting: Surgery

## 2018-11-01 ENCOUNTER — Ambulatory Visit (HOSPITAL_COMMUNITY): Payer: Medicare Other | Admitting: Anesthesiology

## 2018-11-01 ENCOUNTER — Encounter (HOSPITAL_COMMUNITY)
Admission: RE | Disposition: A | Discharge status: Home / Self Care | Payer: Self-pay | Source: Home / Self Care | Attending: Surgery

## 2018-11-01 ENCOUNTER — Ambulatory Visit (HOSPITAL_COMMUNITY): Payer: Medicare Other | Admitting: Physician Assistant

## 2018-11-01 DIAGNOSIS — E210 Primary hyperparathyroidism: Secondary | ICD-10-CM | POA: Diagnosis not present

## 2018-11-01 DIAGNOSIS — Z20828 Contact with and (suspected) exposure to other viral communicable diseases: Secondary | ICD-10-CM | POA: Diagnosis not present

## 2018-11-01 DIAGNOSIS — I2510 Atherosclerotic heart disease of native coronary artery without angina pectoris: Secondary | ICD-10-CM | POA: Diagnosis not present

## 2018-11-01 DIAGNOSIS — I10 Essential (primary) hypertension: Secondary | ICD-10-CM | POA: Diagnosis not present

## 2018-11-01 DIAGNOSIS — Z79899 Other long term (current) drug therapy: Secondary | ICD-10-CM | POA: Insufficient documentation

## 2018-11-01 DIAGNOSIS — D351 Benign neoplasm of parathyroid gland: Secondary | ICD-10-CM | POA: Diagnosis not present

## 2018-11-01 DIAGNOSIS — M1990 Unspecified osteoarthritis, unspecified site: Secondary | ICD-10-CM | POA: Diagnosis not present

## 2018-11-01 DIAGNOSIS — Z7982 Long term (current) use of aspirin: Secondary | ICD-10-CM | POA: Insufficient documentation

## 2018-11-01 DIAGNOSIS — E7800 Pure hypercholesterolemia, unspecified: Secondary | ICD-10-CM | POA: Insufficient documentation

## 2018-11-01 HISTORY — PX: PARATHYROIDECTOMY: SHX19

## 2018-11-01 SURGERY — PARATHYROIDECTOMY
Anesthesia: General | Site: Neck

## 2018-11-01 MED ORDER — BUPIVACAINE HCL (PF) 0.25 % IJ SOLN
INTRAMUSCULAR | Status: AC
  Filled 2018-11-01: qty 30

## 2018-11-01 MED ORDER — SUGAMMADEX SODIUM 200 MG/2ML IV SOLN
INTRAVENOUS | Status: DC | PRN
  Administered 2018-11-01: 250 mg via INTRAVENOUS

## 2018-11-01 MED ORDER — CHLORHEXIDINE GLUCONATE CLOTH 2 % EX PADS: 6.0000 | MEDICATED_PAD | Freq: Once | CUTANEOUS | Status: DC

## 2018-11-01 MED ORDER — CEFAZOLIN SODIUM-DEXTROSE 2-4 GM/100ML-% IV SOLN
2.0000 g | INTRAVENOUS | Status: AC
  Administered 2018-11-01: 2 g via INTRAVENOUS
  Filled 2018-11-01: qty 100

## 2018-11-01 MED ORDER — ACETAMINOPHEN 325 MG PO TABS: 325.0000 mg | ORAL_TABLET | Freq: Once | ORAL | Status: DC | PRN

## 2018-11-01 MED ORDER — DEXAMETHASONE SODIUM PHOSPHATE 10 MG/ML IJ SOLN
INTRAMUSCULAR | Status: DC | PRN
  Administered 2018-11-01: 10 mg via INTRAVENOUS

## 2018-11-01 MED ORDER — LACTATED RINGERS IV SOLN: INTRAVENOUS | Status: DC

## 2018-11-01 MED ORDER — ROCURONIUM BROMIDE 10 MG/ML (PF) SYRINGE
PREFILLED_SYRINGE | INTRAVENOUS | Status: AC
  Filled 2018-11-01: qty 10

## 2018-11-01 MED ORDER — ACETAMINOPHEN 10 MG/ML IV SOLN: 1000.0000 mg | Freq: Once | INTRAVENOUS | Status: DC | PRN

## 2018-11-01 MED ORDER — MIDAZOLAM HCL 2 MG/2ML IJ SOLN
INTRAMUSCULAR | Status: AC
  Filled 2018-11-01: qty 2

## 2018-11-01 MED ORDER — FENTANYL CITRATE (PF) 250 MCG/5ML IJ SOLN
INTRAMUSCULAR | Status: AC
  Filled 2018-11-01: qty 5

## 2018-11-01 MED ORDER — LACTATED RINGERS IV SOLN
INTRAVENOUS | Status: DC
  Administered 2018-11-01: 06:00:00 via INTRAVENOUS
  Administered 2018-11-01: 1000 mL via INTRAVENOUS

## 2018-11-01 MED ORDER — DEXAMETHASONE SODIUM PHOSPHATE 10 MG/ML IJ SOLN
INTRAMUSCULAR | Status: AC
  Filled 2018-11-01: qty 1

## 2018-11-01 MED ORDER — LIDOCAINE 2% (20 MG/ML) 5 ML SYRINGE
INTRAMUSCULAR | Status: DC | PRN
  Administered 2018-11-01: 100 mg via INTRAVENOUS

## 2018-11-01 MED ORDER — MIDAZOLAM HCL 2 MG/2ML IJ SOLN
INTRAMUSCULAR | Status: DC | PRN
  Administered 2018-11-01: 2 mg via INTRAVENOUS

## 2018-11-01 MED ORDER — ONDANSETRON HCL 4 MG/2ML IJ SOLN
INTRAMUSCULAR | Status: DC | PRN
  Administered 2018-11-01: 4 mg via INTRAVENOUS

## 2018-11-01 MED ORDER — FENTANYL CITRATE (PF) 100 MCG/2ML IJ SOLN: 25.0000 ug | INTRAMUSCULAR | Status: DC | PRN

## 2018-11-01 MED ORDER — BUPIVACAINE HCL 0.25 % IJ SOLN
INTRAMUSCULAR | Status: DC | PRN
  Administered 2018-11-01: 10 mL

## 2018-11-01 MED ORDER — SUGAMMADEX SODIUM 500 MG/5ML IV SOLN
INTRAVENOUS | Status: AC
  Filled 2018-11-01: qty 5

## 2018-11-01 MED ORDER — PROMETHAZINE HCL 25 MG/ML IJ SOLN: 6.2500 mg | INTRAMUSCULAR | Status: DC | PRN

## 2018-11-01 MED ORDER — PROPOFOL 10 MG/ML IV BOLUS
INTRAVENOUS | Status: DC | PRN
  Administered 2018-11-01: 150 mg via INTRAVENOUS

## 2018-11-01 MED ORDER — TRAMADOL HCL 50 MG PO TABS: 50.0000 mg | ORAL_TABLET | Freq: Four times a day (QID) | ORAL | 0 refills | Status: DC | PRN

## 2018-11-01 MED ORDER — EPHEDRINE 5 MG/ML INJ
INTRAVENOUS | Status: AC
  Filled 2018-11-01: qty 10

## 2018-11-01 MED ORDER — EPHEDRINE SULFATE-NACL 50-0.9 MG/10ML-% IV SOSY
PREFILLED_SYRINGE | INTRAVENOUS | Status: DC | PRN
  Administered 2018-11-01: 10 mg via INTRAVENOUS

## 2018-11-01 MED ORDER — ROCURONIUM BROMIDE 10 MG/ML (PF) SYRINGE
PREFILLED_SYRINGE | INTRAVENOUS | Status: DC | PRN
  Administered 2018-11-01: 60 mg via INTRAVENOUS

## 2018-11-01 MED ORDER — SODIUM CHLORIDE 0.9 % IR SOLN
Status: DC | PRN
  Administered 2018-11-01: 1000 mL

## 2018-11-01 MED ORDER — PROPOFOL 10 MG/ML IV BOLUS
INTRAVENOUS | Status: AC
  Filled 2018-11-01: qty 20

## 2018-11-01 MED ORDER — ACETAMINOPHEN 160 MG/5ML PO SOLN: 325.0000 mg | Freq: Once | ORAL | Status: DC | PRN

## 2018-11-01 MED ORDER — ONDANSETRON HCL 4 MG/2ML IJ SOLN
INTRAMUSCULAR | Status: AC
  Filled 2018-11-01: qty 2

## 2018-11-01 MED ORDER — MEPERIDINE HCL 50 MG/ML IJ SOLN: 6.2500 mg | INTRAMUSCULAR | Status: DC | PRN

## 2018-11-01 MED ORDER — LIDOCAINE 2% (20 MG/ML) 5 ML SYRINGE
INTRAMUSCULAR | Status: AC
  Filled 2018-11-01: qty 5

## 2018-11-01 MED ORDER — FENTANYL CITRATE (PF) 250 MCG/5ML IJ SOLN
INTRAMUSCULAR | Status: DC | PRN
  Administered 2018-11-01: 150 ug via INTRAVENOUS

## 2018-11-01 MED ORDER — SUCCINYLCHOLINE CHLORIDE 200 MG/10ML IV SOSY
PREFILLED_SYRINGE | INTRAVENOUS | Status: AC
  Filled 2018-11-01: qty 10

## 2018-11-01 SURGICAL SUPPLY — 33 items
ADH SKN CLS APL DERMABOND .7 (GAUZE/BANDAGES/DRESSINGS) ×1
APL PRP STRL LF DISP 70% ISPRP (MISCELLANEOUS) ×1
ATTRACTOMAT 16X20 MAGNETIC DRP (DRAPES) ×3 IMPLANT
BLADE SURG 15 STRL LF DISP TIS (BLADE) ×1 IMPLANT
BLADE SURG 15 STRL SS (BLADE) ×3
CHLORAPREP W/TINT 26 (MISCELLANEOUS) ×3 IMPLANT
CLIP VESOCCLUDE MED 6/CT (CLIP) ×6 IMPLANT
CLIP VESOCCLUDE SM WIDE 6/CT (CLIP) ×6 IMPLANT
COVER SURGICAL LIGHT HANDLE (MISCELLANEOUS) ×3 IMPLANT
COVER WAND RF STERILE (DRAPES) ×3 IMPLANT
DERMABOND ADVANCED (GAUZE/BANDAGES/DRESSINGS) ×2
DERMABOND ADVANCED .7 DNX12 (GAUZE/BANDAGES/DRESSINGS) IMPLANT
DRAPE LAPAROTOMY T 98X78 PEDS (DRAPES) ×3 IMPLANT
ELECT PENCIL ROCKER SW 15FT (MISCELLANEOUS) ×3 IMPLANT
ELECT REM PT RETURN 15FT ADLT (MISCELLANEOUS) ×3 IMPLANT
GAUZE 4X4 16PLY RFD (DISPOSABLE) ×3 IMPLANT
GLOVE SURG ORTHO 8.0 STRL STRW (GLOVE) ×3 IMPLANT
GOWN STRL REUS W/TWL XL LVL3 (GOWN DISPOSABLE) ×9 IMPLANT
HEMOSTAT SURGICEL 2X4 FIBR (HEMOSTASIS) IMPLANT
ILLUMINATOR WAVEGUIDE N/F (MISCELLANEOUS) IMPLANT
KIT BASIN OR (CUSTOM PROCEDURE TRAY) ×3 IMPLANT
KIT TURNOVER KIT A (KITS) IMPLANT
NDL HYPO 25X1 1.5 SAFETY (NEEDLE) ×1 IMPLANT
NEEDLE HYPO 25X1 1.5 SAFETY (NEEDLE) ×3 IMPLANT
PACK BASIC VI WITH GOWN DISP (CUSTOM PROCEDURE TRAY) ×3 IMPLANT
SUT MNCRL AB 4-0 PS2 18 (SUTURE) ×3 IMPLANT
SUT VIC AB 3-0 SH 18 (SUTURE) ×3 IMPLANT
SYR BULB IRRIGATION 50ML (SYRINGE) ×3 IMPLANT
SYR CONTROL 10ML LL (SYRINGE) ×3 IMPLANT
TOWEL OR 17X26 10 PK STRL BLUE (TOWEL DISPOSABLE) ×3 IMPLANT
TOWEL OR NON WOVEN STRL DISP B (DISPOSABLE) ×3 IMPLANT
TUBING CONNECTING 10 (TUBING) ×2 IMPLANT
TUBING CONNECTING 10' (TUBING) ×1

## 2018-11-01 NOTE — Anesthesia Postprocedure Evaluation (Signed)
Anesthesia Post Note  Patient: Ricky Wilson  Procedure(s) Performed: PARATHYROIDECTOMY (N/A Neck)     Patient location during evaluation: PACU Anesthesia Type: General Level of consciousness: awake and alert Pain management: pain level controlled Vital Signs Assessment: post-procedure vital signs reviewed and stable Respiratory status: spontaneous breathing, nonlabored ventilation, respiratory function stable and patient connected to nasal cannula oxygen Cardiovascular status: blood pressure returned to baseline and stable Postop Assessment: no apparent nausea or vomiting Anesthetic complications: no    Last Vitals:  Vitals:   11/01/18 0845 11/01/18 0900  BP: (!) 147/82 (!) 145/84  Pulse: 75 71  Resp: (!) 8 (!) 8  Temp:  36.4 C  SpO2: 100% 94%    Last Pain:  Vitals:   11/01/18 0900  TempSrc:   PainSc: 0-No pain                 Effie Berkshire

## 2018-11-01 NOTE — Interval H&P Note (Signed)
History and Physical Interval Note:  11/01/2018 7:02 AM  [facility] [place]  has presented today for surgery, with the diagnosis of PRIMARY HYPERPARATHYROIDISM.  The various methods of treatment have been discussed with the patient and family. After consideration of risks, benefits and other options for treatment, the patient has consented to    Procedure(s): PARATHYROIDECTOMY (N/A) as a surgical intervention.    The patient's history has been reviewed, patient examined, no change in status, stable for surgery.  I have reviewed the patient's chart and labs.  Questions were answered to the patient's satisfaction.    Armandina Gemma, [place] [facility] Office: [place]

## 2018-11-01 NOTE — Transfer of Care (Signed)
Immediate Anesthesia Transfer of Care Note  Patient: Ricky Wilson  Procedure(s) Performed: PARATHYROIDECTOMY (N/A Neck)  Patient Location: PACU  Anesthesia Type:General  Level of Consciousness: awake, alert  and oriented  Airway & Oxygen Therapy: Patient Spontanous Breathing and Patient connected to face mask oxygen  Post-op Assessment: Report given to RN and Post -op Vital signs reviewed and stable  Post vital signs: Reviewed and stable  Last Vitals:  Vitals Value Taken Time  BP    Temp    Pulse 88 11/01/18 0839  Resp 19 11/01/18 0839  SpO2 99 % 11/01/18 0839  Vitals shown include unvalidated device data.  Last Pain:  Vitals:   11/01/18 0617  TempSrc:   PainSc: 0-No pain         Complications: No apparent anesthesia complications

## 2018-11-01 NOTE — Op Note (Signed)
OPERATIVE REPORT - PARATHYROIDECTOMY  Preoperative diagnosis: Primary hyperparathyroidism  Postop diagnosis: Same  Procedure: Left inferior minimally invasive parathyroidectomy  Surgeon:  Armandina Gemma, MD  Anesthesia: General endotracheal  Estimated blood loss: Minimal  Preparation: ChloraPrep  Indications: Patient returns for follow-up and to review the results of his diagnostic testing. Patient had undergone 2 previous nuclear medicine scans which failed to identify a parathyroid adenoma. At my request he underwent an ultrasound examination which demonstrated a 1.2 cm hypoechoic nodule behind the left thyroid lobe which was suspicious for parathyroid adenoma. 24-hour urine collection was elevated at 428. Patient subsequently underwent a 4D CT scan of the neck on August 28, 2018. This demonstrated a nodule high in the left thyroid lobe which was felt to be parathyroid tissue although it was not felt to be typical of parathyroid adenoma. Patient now comes to surgery for neck exploration and parathyroidectomy.  Procedure: The patient was prepared in the pre-operative holding area. The patient was brought to the operating room and placed in a supine position on the operating room table. Following administration of general anesthesia, the patient was positioned and then prepped and draped in the usual strict aseptic fashion. After ascertaining that an adequate level of anesthesia been achieved, a neck incision was made with a #15 blade. Dissection was carried through subcutaneous tissues and platysma. Hemostasis was obtained with the electrocautery. Skin flaps were developed circumferentially and a Weitlander retractor was placed for exposure.  Strap muscles were incised in the midline. Strap muscles were reflected lateralley exposing the thyroid lobe. With gentle blunt dissection the thyroid lobe was mobilized.  Dissection was carried through adipose tissue and an enlarged parathyroid gland was  identified. It was gently mobilized. Vascular structures were divided between small ligaclips. Care was taken to avoid the recurrent laryngeal nerve and the esophagus. The parathyroid gland was completely excised. It was submitted to pathology where frozen section confirmed parathyroid tissue consistent with adenoma.  Neck was irrigated with warm saline and good hemostasis was noted. Fibrillar was placed in the operative field. Strap muscles were approximated in the midline with interrupted 3-0 Vicryl sutures. Platysma was closed with interrupted 3-0 Vicryl sutures. Marcaine was infiltrated circumferentially. Skin was closed with a running 4-0 Monocryl subcuticular suture. Wound was washed and dried and Dermabond was applied. Patient was awakened from anesthesia and brought to the recovery room. The patient tolerated the procedure well.   Armandina Gemma, MD [facility] Office: [phone]

## 2018-11-01 NOTE — Anesthesia Procedure Notes (Signed)
Procedure Name: Intubation Date/Time: 11/01/2018 7:37 AM Performed by: Sharlette Dense, CRNA Patient Re-evaluated:Patient Re-evaluated prior to induction Oxygen Delivery Method: Circle system utilized Preoxygenation: Pre-oxygenation with 100% oxygen Induction Type: IV induction Ventilation: Mask ventilation without difficulty and Oral airway inserted - appropriate to patient size Laryngoscope Size: Miller and 3 Grade View: Grade I Tube type: Reinforced Tube size: 7.5 mm Number of attempts: 1 Airway Equipment and Method: Stylet Placement Confirmation: ETT inserted through vocal cords under direct vision,  positive ETCO2 and breath sounds checked- equal and bilateral Secured at: 22 cm Tube secured with: Tape Dental Injury: Teeth and Oropharynx as per pre-operative assessment

## 2018-11-02 ENCOUNTER — Encounter (HOSPITAL_COMMUNITY): Payer: Self-pay | Admitting: Surgery

## 2018-11-26 DIAGNOSIS — E210 Primary hyperparathyroidism: Secondary | ICD-10-CM | POA: Diagnosis not present

## 2018-11-26 DIAGNOSIS — I10 Essential (primary) hypertension: Secondary | ICD-10-CM | POA: Diagnosis not present

## 2018-11-26 DIAGNOSIS — E049 Nontoxic goiter, unspecified: Secondary | ICD-10-CM | POA: Diagnosis not present

## 2018-11-26 DIAGNOSIS — E559 Vitamin D deficiency, unspecified: Secondary | ICD-10-CM | POA: Diagnosis not present

## 2018-12-10 DIAGNOSIS — Z23 Encounter for immunization: Secondary | ICD-10-CM | POA: Diagnosis not present

## 2018-12-26 ENCOUNTER — Other Ambulatory Visit (HOSPITAL_COMMUNITY)
Admission: RE | Admit: 2018-12-26 | Discharge: 2018-12-26 | Disposition: A | Discharge status: Home / Self Care | Payer: Medicare Other | Source: Ambulatory Visit | Attending: Cardiology | Admitting: Cardiology

## 2018-12-26 DIAGNOSIS — Z79899 Other long term (current) drug therapy: Secondary | ICD-10-CM | POA: Insufficient documentation

## 2018-12-26 DIAGNOSIS — E782 Mixed hyperlipidemia: Secondary | ICD-10-CM | POA: Diagnosis not present

## 2018-12-26 LAB — HEPATIC FUNCTION PANEL
ALT: 41 U/L (ref 0–44)
AST: 30 U/L (ref 15–41)
Albumin: 4.4 g/dL (ref 3.5–5.0)
Alkaline Phosphatase: 69 U/L (ref 38–126)
Bilirubin, Direct: 0.1 mg/dL (ref 0.0–0.2)
Indirect Bilirubin: 0.7 mg/dL (ref 0.3–0.9)
Total Bilirubin: 0.8 mg/dL (ref 0.3–1.2)
Total Protein: 7.2 g/dL (ref 6.5–8.1)

## 2018-12-26 LAB — LIPID PANEL
Cholesterol: 152 mg/dL (ref 0–200)
HDL: 38 mg/dL — ABNORMAL LOW (ref >40)
LDL Cholesterol: 75 mg/dL (ref 0–99)
Total CHOL/HDL Ratio: 4 RATIO
Triglycerides: 196 mg/dL — ABNORMAL HIGH (ref <150)
VLDL: 39 mg/dL (ref 0–40)

## 2019-01-02 ENCOUNTER — Telehealth: Payer: Self-pay | Admitting: *Deleted

## 2019-01-02 MED ORDER — ATORVASTATIN CALCIUM 40 MG PO TABS: 40.0000 mg | ORAL_TABLET | Freq: Every day | ORAL | 3 refills | Status: DC

## 2019-01-02 NOTE — Telephone Encounter (Signed)
-----   Message from Satira Sark, MD sent at 12/27/2018  8:03 PM EDT ----- Results reviewed. Liver tests are normal. Triglycerides mildly increased at 196. LDL has come up some to 75 from 50 previously. He is now on Lipitor 20 mg daily (previously Crestor). If tolerating well could consider going up to 40 mg daily to get more aggressive with LDL.

## 2019-01-02 NOTE — Telephone Encounter (Signed)
Patient informed and agrees and verbalizes understanding of plan. Copy sent to PCP

## 2019-01-09 DIAGNOSIS — E7800 Pure hypercholesterolemia, unspecified: Secondary | ICD-10-CM | POA: Diagnosis not present

## 2019-01-09 DIAGNOSIS — Z299 Encounter for prophylactic measures, unspecified: Secondary | ICD-10-CM | POA: Diagnosis not present

## 2019-01-09 DIAGNOSIS — Z6830 Body mass index (BMI) 30.0-30.9, adult: Secondary | ICD-10-CM | POA: Diagnosis not present

## 2019-01-09 DIAGNOSIS — I10 Essential (primary) hypertension: Secondary | ICD-10-CM | POA: Diagnosis not present

## 2019-01-09 DIAGNOSIS — E210 Primary hyperparathyroidism: Secondary | ICD-10-CM | POA: Diagnosis not present

## 2019-01-09 DIAGNOSIS — E039 Hypothyroidism, unspecified: Secondary | ICD-10-CM | POA: Diagnosis not present

## 2019-02-19 DIAGNOSIS — H43392 Other vitreous opacities, left eye: Secondary | ICD-10-CM | POA: Diagnosis not present

## 2019-03-08 ENCOUNTER — Ambulatory Visit: Payer: Medicare Other | Admitting: Cardiology

## 2019-03-12 DIAGNOSIS — L819 Disorder of pigmentation, unspecified: Secondary | ICD-10-CM | POA: Diagnosis not present

## 2019-03-12 DIAGNOSIS — Z85828 Personal history of other malignant neoplasm of skin: Secondary | ICD-10-CM | POA: Diagnosis not present

## 2019-03-12 DIAGNOSIS — L570 Actinic keratosis: Secondary | ICD-10-CM | POA: Diagnosis not present

## 2019-04-03 ENCOUNTER — Other Ambulatory Visit: Payer: Self-pay | Admitting: Cardiology

## 2019-04-05 ENCOUNTER — Ambulatory Visit: Payer: Medicare Other | Admitting: Cardiology

## 2019-04-18 DIAGNOSIS — Z23 Encounter for immunization: Secondary | ICD-10-CM | POA: Diagnosis not present

## 2019-05-24 ENCOUNTER — Ambulatory Visit: Payer: Medicare Other | Admitting: Cardiology

## 2019-05-24 DIAGNOSIS — Z23 Encounter for immunization: Secondary | ICD-10-CM | POA: Diagnosis not present

## 2019-06-11 ENCOUNTER — Other Ambulatory Visit: Payer: Self-pay

## 2019-06-11 ENCOUNTER — Encounter: Payer: Self-pay | Admitting: Cardiology

## 2019-06-11 ENCOUNTER — Ambulatory Visit (INDEPENDENT_AMBULATORY_CARE_PROVIDER_SITE_OTHER): Payer: Medicare Other | Admitting: Cardiology

## 2019-06-11 VITALS — BP 156/82 | HR 63 | Ht 71.0 in | Wt 227.6 lb

## 2019-06-11 DIAGNOSIS — E782 Mixed hyperlipidemia: Secondary | ICD-10-CM | POA: Diagnosis not present

## 2019-06-11 DIAGNOSIS — I10 Essential (primary) hypertension: Secondary | ICD-10-CM | POA: Diagnosis not present

## 2019-06-11 DIAGNOSIS — I25119 Atherosclerotic heart disease of native coronary artery with unspecified angina pectoris: Secondary | ICD-10-CM

## 2019-06-11 NOTE — Patient Instructions (Addendum)

## 2019-06-11 NOTE — Progress Notes (Signed)
Cardiology Office Note  Date: 06/11/2019   ID: Ricky Wilson, [place] [birth year], MRN [id number]  PCP:  Glenda Chroman, MD  Cardiologist:  Rozann Lesches, MD Electrophysiologist:  None   Chief Complaint  Patient presents with  . Cardiac follow-up    History of Present Illness: Ricky Wilson is a 64 y.o. male last assessed via telehealth encounter in June 2020.  He is here for a routine visit.  He does not report any active angina at this time on medical therapy.  He has had his second dose of coronavirus vaccine and does plan to go back to the gym in the next few weeks to start a more regular exercise plan.  He underwent a parathyroidectomy back in August 2020.  No obvious perioperative complications.  I went over his medications which include aspirin, Lipitor, Lotensin, and Toprol-XL.  He is due for follow-up lab work with physical per Dr. Woody Seller soon.  I personally reviewed his ECG today which shows sinus rhythm with IVCD.  Past Medical History:  Diagnosis Date  . Arthritis   . Carpal tunnel syndrome, bilateral   . Coronary atherosclerosis of native coronary artery    Occluded RCA with collaterals 2004  . Gout   . Hypertension   . Mixed hyperlipidemia     Past Surgical History:  Procedure Laterality Date  . CARPAL TUNNEL RELEASE  03/31/2011   Procedure: CARPAL TUNNEL RELEASE;  Surgeon: Cammie Sickle., MD;  Location: [facility];  Service: Orthopedics;  Laterality: Left;  . CARPAL TUNNEL RELEASE  05/17/2011   Procedure: CARPAL TUNNEL RELEASE;  Surgeon: Cammie Sickle., MD;  Location: [facility];  Service: Orthopedics;  Laterality: Right;  . ELBOW SURGERY  2008   right  . PARATHYROIDECTOMY N/A 11/01/2018   Procedure: PARATHYROIDECTOMY;  Surgeon: Armandina Gemma, MD;  [facility];  Service: General;  Laterality: N/A;  . tooth extract     and bone grafts  . TORN LEFT MEDIAL MENISCUS  2008  . TOTAL SHOULDER ARTHROPLASTY Left  03/02/2017   Procedure: LEFT TOTAL SHOULDER ARTHROPLASTY;  Surgeon: Justice Britain, MD;  Location: [facility];  Service: Orthopedics;  Laterality: Left;  . TRIGGER FINGER RELEASE Bilateral    thumbs  . VASECTOMY  1977    Current Outpatient Medications  Medication Sig Dispense Refill  . aspirin EC 81 MG tablet Take 81 mg by mouth daily.    . benazepril (LOTENSIN) 10 MG tablet TAKE 1 TABLET (10 MG TOTAL) BY MOUTH EVERY EVENING. 90 tablet 3  . diclofenac sodium (VOLTAREN) 1 % GEL Voltaren Gel 3 grams to 3 large joints upto TID 3 TUBES with 3 refills (Patient taking differently: Apply 4 g topically as needed (SHOULDER PAIN). ) 3 Tube 3  . famotidine (PEPCID) 20 MG tablet Take 20 mg by mouth daily as needed for heartburn or indigestion.    . metoprolol succinate (TOPROL XL) 25 MG 24 hr tablet Take 0.5 tablets (12.5 mg total) by mouth daily. 45 tablet 3  . Multiple Vitamin (MULTIVITAMIN) tablet Take 1 tablet by mouth daily.      . Omega-3 Fatty Acids (FISH OIL) 1000 MG CAPS Take 1 capsule (1,000 mg total) by mouth 2 (two) times daily. (Patient taking differently: Take 1,000 mg by mouth daily. ) 60 each 0  . vitamin C (ASCORBIC ACID) 500 MG tablet Take 500 mg by mouth daily.    Marland Kitchen atorvastatin (LIPITOR) 40 MG tablet Take 1 tablet (  40 mg total) by mouth daily. 90 tablet 3   No current facility-administered medications for this visit.   Allergies:  Hydrocodone   ROS:   Recent lower back pain, thinks he may have strained something working in the yard.  Physical Exam: VS:  BP (!) 156/82 Comment: woke up with back pain this morning  Pulse 63   Ht 5\' 11"  (1.803 m)   Wt 227 lb 9.6 oz (103.2 kg)   SpO2 98%   BMI 31.74 kg/m , BMI Body mass index is 31.74 kg/m.  Wt Readings from Last 3 Encounters:  06/11/19 227 lb 9.6 oz (103.2 kg)  11/01/18 223 lb 5 oz (101.3 kg)  10/30/18 223 lb 5 oz (101.3 kg)    General: Patient appears comfortable at rest. HEENT: Conjunctiva and lids normal, wearing a  mask. Neck: Supple, no elevated JVP or carotid bruits, no thyromegaly. Lungs: Clear to auscultation, nonlabored breathing at rest. Cardiac: Regular rate and rhythm, no S3 or significant systolic murmur. Abdomen: Soft, nontender, bowel sounds present. Extremities: No pitting edema, distal pulses 2+.  ECG:  An ECG dated 02/07/2018 was personally reviewed today and demonstrated:  Sinus rhythm with IVCD.  Recent Labwork: 10/30/2018: BUN 10; Creatinine, Ser 1.13; Hemoglobin 14.8; Platelets 123; Potassium 4.5; Sodium 139 12/26/2018: ALT 41; AST 30     Component Value Date/Time   CHOL 152 12/26/2018 0847   TRIG 196 (H) 12/26/2018 0847   HDL 38 (L) 12/26/2018 0847   CHOLHDL 4.0 12/26/2018 0847   VLDL 39 12/26/2018 0847   LDLCALC 75 12/26/2018 0847    Other Studies Reviewed Today:  Lexiscan Myoview 01/21/2016:  No diagnostic ST segment changes to indicate ischemia.  No significant myocardial perfusion defects to indicate scar or ischemia.  This is a low risk study.  Nuclear stress EF: 58%.  Assessment and Plan:  1.  CAD with known occlusion of the RCA associated with collateral filling.  He is doing well without active angina on medical therapy and plans to start back with regular exercise at the gym soon.  Continue aspirin, Lotensin, Toprol-XL, and Lipitor.  2.  Mixed hyperlipidemia, he is continuing on Lipitor at this point and has follow-up lab work with PCP in the near future.  Last LDL was 75.  3.  Essential hypertension, blood pressure is up today in the setting of lower back pain.  Continue Lotensin and Toprol-XL.  Keep scheduled follow-up with PCP.  Medication Adjustments/Labs and Tests Ordered: Current medicines are reviewed at length with the patient today.  Concerns regarding medicines are outlined above.   Tests Ordered: Orders Placed This Encounter  Procedures  . EKG 12-Lead    Medication Changes: No orders of the defined types were placed in this  encounter.   Disposition:  Follow up 6 months in the [place] office.  Signed, Satira Sark, MD, [facility] 06/11/2019 10:46 AM    [place] at [place], [place], [place] [address] Phone: [phone]; Fax: [phone]

## 2019-06-13 ENCOUNTER — Ambulatory Visit: Payer: Medicare Other | Admitting: Cardiology

## 2019-06-21 DIAGNOSIS — Z6830 Body mass index (BMI) 30.0-30.9, adult: Secondary | ICD-10-CM | POA: Diagnosis not present

## 2019-06-21 DIAGNOSIS — E7800 Pure hypercholesterolemia, unspecified: Secondary | ICD-10-CM | POA: Diagnosis not present

## 2019-06-21 DIAGNOSIS — Z1211 Encounter for screening for malignant neoplasm of colon: Secondary | ICD-10-CM | POA: Diagnosis not present

## 2019-06-21 DIAGNOSIS — Z7189 Other specified counseling: Secondary | ICD-10-CM | POA: Diagnosis not present

## 2019-06-21 DIAGNOSIS — Z6828 Body mass index (BMI) 28.0-28.9, adult: Secondary | ICD-10-CM | POA: Diagnosis not present

## 2019-06-21 DIAGNOSIS — Z0000 Encounter for general adult medical examination without abnormal findings: Secondary | ICD-10-CM | POA: Diagnosis not present

## 2019-06-21 DIAGNOSIS — D696 Thrombocytopenia, unspecified: Secondary | ICD-10-CM | POA: Diagnosis not present

## 2019-06-21 DIAGNOSIS — Z1339 Encounter for screening examination for other mental health and behavioral disorders: Secondary | ICD-10-CM | POA: Diagnosis not present

## 2019-06-21 DIAGNOSIS — Z1331 Encounter for screening for depression: Secondary | ICD-10-CM | POA: Diagnosis not present

## 2019-06-21 DIAGNOSIS — Z125 Encounter for screening for malignant neoplasm of prostate: Secondary | ICD-10-CM | POA: Diagnosis not present

## 2019-06-21 DIAGNOSIS — I10 Essential (primary) hypertension: Secondary | ICD-10-CM | POA: Diagnosis not present

## 2019-06-21 DIAGNOSIS — Z79899 Other long term (current) drug therapy: Secondary | ICD-10-CM | POA: Diagnosis not present

## 2019-06-21 DIAGNOSIS — R5383 Other fatigue: Secondary | ICD-10-CM | POA: Diagnosis not present

## 2019-06-21 DIAGNOSIS — E039 Hypothyroidism, unspecified: Secondary | ICD-10-CM | POA: Diagnosis not present

## 2019-06-21 DIAGNOSIS — Z299 Encounter for prophylactic measures, unspecified: Secondary | ICD-10-CM | POA: Diagnosis not present

## 2019-07-24 ENCOUNTER — Other Ambulatory Visit: Payer: Self-pay | Admitting: Cardiology

## 2019-08-06 DIAGNOSIS — E039 Hypothyroidism, unspecified: Secondary | ICD-10-CM | POA: Diagnosis not present

## 2019-08-06 DIAGNOSIS — I2510 Atherosclerotic heart disease of native coronary artery without angina pectoris: Secondary | ICD-10-CM | POA: Diagnosis not present

## 2019-08-06 DIAGNOSIS — K429 Umbilical hernia without obstruction or gangrene: Secondary | ICD-10-CM | POA: Diagnosis not present

## 2019-08-06 DIAGNOSIS — I10 Essential (primary) hypertension: Secondary | ICD-10-CM | POA: Diagnosis not present

## 2019-08-06 DIAGNOSIS — E7800 Pure hypercholesterolemia, unspecified: Secondary | ICD-10-CM | POA: Diagnosis not present

## 2019-08-06 DIAGNOSIS — Z299 Encounter for prophylactic measures, unspecified: Secondary | ICD-10-CM | POA: Diagnosis not present

## 2019-08-06 DIAGNOSIS — Z6830 Body mass index (BMI) 30.0-30.9, adult: Secondary | ICD-10-CM | POA: Diagnosis not present

## 2019-08-27 ENCOUNTER — Ambulatory Visit: Payer: Medicare Other | Admitting: Cardiology

## 2019-08-27 DIAGNOSIS — D696 Thrombocytopenia, unspecified: Secondary | ICD-10-CM | POA: Diagnosis not present

## 2019-12-13 DIAGNOSIS — M109 Gout, unspecified: Secondary | ICD-10-CM | POA: Diagnosis not present

## 2019-12-13 DIAGNOSIS — Z299 Encounter for prophylactic measures, unspecified: Secondary | ICD-10-CM | POA: Diagnosis not present

## 2019-12-15 ENCOUNTER — Other Ambulatory Visit: Payer: Self-pay | Admitting: Cardiology

## 2019-12-18 DIAGNOSIS — Z23 Encounter for immunization: Secondary | ICD-10-CM | POA: Diagnosis not present

## 2019-12-19 ENCOUNTER — Encounter: Payer: Self-pay | Admitting: Cardiology

## 2019-12-19 ENCOUNTER — Ambulatory Visit (INDEPENDENT_AMBULATORY_CARE_PROVIDER_SITE_OTHER): Payer: Medicare Other | Admitting: Cardiology

## 2019-12-19 VITALS — BP 142/78 | HR 68 | Ht 71.0 in | Wt 226.0 lb

## 2019-12-19 DIAGNOSIS — I25119 Atherosclerotic heart disease of native coronary artery with unspecified angina pectoris: Secondary | ICD-10-CM

## 2019-12-19 DIAGNOSIS — E782 Mixed hyperlipidemia: Secondary | ICD-10-CM

## 2019-12-19 NOTE — Progress Notes (Signed)
Cardiology Office Note  Date: 12/19/2019   ID: [facility] [place], [place] Nov 19, 1955, MRN [id number]  PCP:  Glenda Chroman, MD  Cardiologist:  Rozann Lesches, MD Electrophysiologist:  None   Chief Complaint  Patient presents with  . Cardiac follow-up    History of Present Illness: Ricky Wilson is a 64 y.o. male last seen in March.  He is here for a routine visit.  He states that he has been doing well, no progressive angina symptoms on medical therapy.  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  I reviewed his medications which are stable and outlined below.  He does not report any intolerances to Lipitor.  Continues to follow with Dr. Woody Seller, last LDL was 75.  I reviewed his ECG from March.  Past Medical History:  Diagnosis Date  . Arthritis   . Carpal tunnel syndrome, bilateral   . Coronary atherosclerosis of native coronary artery    Occluded RCA with collaterals 2004  . Essential hypertension   . Gout   . Mixed hyperlipidemia     Past Surgical History:  Procedure Laterality Date  . CARPAL TUNNEL RELEASE  03/31/2011   Procedure: CARPAL TUNNEL RELEASE;  Surgeon: Cammie Sickle., MD;  Location: [facility];  Service: Orthopedics;  Laterality: Left;  . CARPAL TUNNEL RELEASE  05/17/2011   Procedure: CARPAL TUNNEL RELEASE;  Surgeon: Cammie Sickle., MD;  Location: [facility];  Service: Orthopedics;  Laterality: Right;  . ELBOW SURGERY  2008   right  . PARATHYROIDECTOMY N/A 11/01/2018   Procedure: PARATHYROIDECTOMY;  Surgeon: Armandina Gemma, MD;  [facility];  Service: General;  Laterality: N/A;  . tooth extract     and bone grafts  . TORN LEFT MEDIAL MENISCUS  2008  . TOTAL SHOULDER ARTHROPLASTY Left 03/02/2017   Procedure: LEFT TOTAL SHOULDER ARTHROPLASTY;  Surgeon: Justice Britain, MD;  Location: [facility];  Service: Orthopedics;  Laterality: Left;  . TRIGGER FINGER RELEASE Bilateral    thumbs  .  VASECTOMY  1977    Current Outpatient Medications  Medication Sig Dispense Refill  . aspirin EC 81 MG tablet Take 81 mg by mouth daily.    Marland Kitchen atorvastatin (LIPITOR) 40 MG tablet TAKE 1 TABLET BY MOUTH EVERY DAY 90 tablet 1  . benazepril (LOTENSIN) 10 MG tablet TAKE 1 TABLET (10 MG TOTAL) BY MOUTH EVERY EVENING. 90 tablet 3  . diclofenac sodium (VOLTAREN) 1 % GEL Voltaren Gel 3 grams to 3 large joints upto TID 3 TUBES with 3 refills (Patient taking differently: Apply 4 g topically as needed (SHOULDER PAIN). ) 3 Tube 3  . famotidine (PEPCID) 20 MG tablet Take 20 mg by mouth daily as needed for heartburn or indigestion.    . metoprolol succinate (TOPROL-XL) 25 MG 24 hr tablet TAKE 1/2 TABLET BY MOUTH EVERY DAY 45 tablet 3  . Multiple Vitamin (MULTIVITAMIN) tablet Take 1 tablet by mouth daily.      . Omega-3 Fatty Acids (FISH OIL) 1000 MG CAPS Take 1 capsule (1,000 mg total) by mouth 2 (two) times daily. (Patient taking differently: Take 1,000 mg by mouth daily. ) 60 each 0  . Turmeric (QC TUMERIC COMPLEX) 500 MG CAPS Take 1,000 mg by mouth in the morning and at bedtime.    . vitamin C (ASCORBIC ACID) 500 MG tablet Take 500 mg by mouth daily.     No current facility-administered medications  for this visit.   Allergies:  Hydrocodone   ROS:   No palpitations or syncope.  Physical Exam: VS:  BP (!) 142/78   Pulse 68   Ht 5\' 11"  (1.803 m)   Wt 226 lb (102.5 kg)   SpO2 98%   BMI 31.52 kg/m , BMI Body mass index is 31.52 kg/m.  Wt Readings from Last 3 Encounters:  12/19/19 226 lb (102.5 kg)  06/11/19 227 lb 9.6 oz (103.2 kg)  11/01/18 223 lb 5 oz (101.3 kg)    General: Patient appears comfortable at rest. HEENT: Conjunctiva and lids normal, wearing a mask. Neck: Supple, no elevated JVP or carotid bruits, no thyromegaly. Lungs: Clear to auscultation, nonlabored breathing at rest. Cardiac: Regular rate and rhythm, S4, no significant systolic murmur. Abdomen: Soft, bowel sounds  present. Extremities: No pitting edema, distal pulses 2+.  ECG:  An ECG dated 06/11/2019 was personally reviewed today and demonstrated:  Sinus rhythm with IVCD.  Recent Labwork: 12/26/2018: ALT 41; AST 30     Component Value Date/Time   CHOL 152 12/26/2018 0847   TRIG 196 (H) 12/26/2018 0847   HDL 38 (L) 12/26/2018 0847   CHOLHDL 4.0 12/26/2018 0847   VLDL 39 12/26/2018 0847   LDLCALC 75 12/26/2018 0847    Other Studies Reviewed Today:  Lexiscan Myoview 01/21/2016:  No diagnostic ST segment changes to indicate ischemia.  No significant myocardial perfusion defects to indicate scar or ischemia.  This is a low risk study.  Nuclear stress EF: 58%.  Assessment and Plan:  1.  CAD with known occlusion of the RCA and collateral filling.  He does not report any accelerating angina on medical therapy and has maintained a regular exercise plan.  No changes were made today.  Aspirin, Lotensin, Toprol-XL, and Lipitor.  2.  Mixed hyperlipidemia.  Continue Lipitor with repeat lab work per Dr. Woody Seller.  Last LDL was 75.  Medication Adjustments/Labs and Tests Ordered: Current medicines are reviewed at length with the patient today.  Concerns regarding medicines are outlined above.   Tests Ordered: No orders of the defined types were placed in this encounter.   Medication Changes: No orders of the defined types were placed in this encounter.   Disposition:  Follow up 6 months in the [place] office.  Signed, Ricky Sark, MD, [facility] 12/19/2019 8:35 AM    [place] at [place], [place], [place] [address] Phone: [phone]; Fax: [phone]

## 2019-12-19 NOTE — Patient Instructions (Signed)

## 2019-12-26 DIAGNOSIS — E210 Primary hyperparathyroidism: Secondary | ICD-10-CM | POA: Diagnosis not present

## 2019-12-26 DIAGNOSIS — D696 Thrombocytopenia, unspecified: Secondary | ICD-10-CM | POA: Diagnosis not present

## 2019-12-26 DIAGNOSIS — E039 Hypothyroidism, unspecified: Secondary | ICD-10-CM | POA: Diagnosis not present

## 2019-12-26 DIAGNOSIS — Z299 Encounter for prophylactic measures, unspecified: Secondary | ICD-10-CM | POA: Diagnosis not present

## 2019-12-26 DIAGNOSIS — I10 Essential (primary) hypertension: Secondary | ICD-10-CM | POA: Diagnosis not present

## 2020-01-21 DIAGNOSIS — Z23 Encounter for immunization: Secondary | ICD-10-CM | POA: Diagnosis not present

## 2020-03-09 DIAGNOSIS — L570 Actinic keratosis: Secondary | ICD-10-CM | POA: Diagnosis not present

## 2020-03-11 DIAGNOSIS — H43393 Other vitreous opacities, bilateral: Secondary | ICD-10-CM | POA: Diagnosis not present

## 2020-03-18 DIAGNOSIS — J069 Acute upper respiratory infection, unspecified: Secondary | ICD-10-CM | POA: Diagnosis not present

## 2020-03-18 DIAGNOSIS — Z299 Encounter for prophylactic measures, unspecified: Secondary | ICD-10-CM | POA: Diagnosis not present

## 2020-03-31 DIAGNOSIS — Z299 Encounter for prophylactic measures, unspecified: Secondary | ICD-10-CM | POA: Diagnosis not present

## 2020-03-31 DIAGNOSIS — I10 Essential (primary) hypertension: Secondary | ICD-10-CM | POA: Diagnosis not present

## 2020-03-31 DIAGNOSIS — Z789 Other specified health status: Secondary | ICD-10-CM | POA: Diagnosis not present

## 2020-04-14 DIAGNOSIS — Z6830 Body mass index (BMI) 30.0-30.9, adult: Secondary | ICD-10-CM | POA: Diagnosis not present

## 2020-04-14 DIAGNOSIS — Z789 Other specified health status: Secondary | ICD-10-CM | POA: Diagnosis not present

## 2020-04-14 DIAGNOSIS — I10 Essential (primary) hypertension: Secondary | ICD-10-CM | POA: Diagnosis not present

## 2020-04-14 DIAGNOSIS — E039 Hypothyroidism, unspecified: Secondary | ICD-10-CM | POA: Diagnosis not present

## 2020-04-14 DIAGNOSIS — Z299 Encounter for prophylactic measures, unspecified: Secondary | ICD-10-CM | POA: Diagnosis not present

## 2020-04-14 DIAGNOSIS — Z713 Dietary counseling and surveillance: Secondary | ICD-10-CM | POA: Diagnosis not present

## 2020-06-10 ENCOUNTER — Encounter: Payer: Self-pay | Admitting: Cardiology

## 2020-06-10 ENCOUNTER — Ambulatory Visit (INDEPENDENT_AMBULATORY_CARE_PROVIDER_SITE_OTHER): Payer: Medicare Other | Admitting: Cardiology

## 2020-06-10 VITALS — BP 144/88 | HR 68 | Ht 71.0 in | Wt 233.0 lb

## 2020-06-10 DIAGNOSIS — I25119 Atherosclerotic heart disease of native coronary artery with unspecified angina pectoris: Secondary | ICD-10-CM | POA: Diagnosis not present

## 2020-06-10 DIAGNOSIS — I10 Essential (primary) hypertension: Secondary | ICD-10-CM

## 2020-06-10 DIAGNOSIS — E782 Mixed hyperlipidemia: Secondary | ICD-10-CM | POA: Diagnosis not present

## 2020-06-10 NOTE — Progress Notes (Signed)
Cardiology Office Note  Date: 06/10/2020   ID: Joslyn Hy Mus[place], [place] [birth year], MRN [id number]  PCP:  Glenda Chroman, MD  Cardiologist:  Rozann Lesches, MD Electrophysiologist:  None   Chief Complaint  Patient presents with  . Cardiac follow-up    History of Present Illness: Ricky Wilson is a 66 y.o. male last seen in September 2021.  He presents for routine visit.  Reports no angina symptoms on medical therapy, still exercising at the gym 5 days a week although has not been walking as much.  He has a group that he typically walks with and plans to get started back in April.  He would like to lose some weight as well.  He states that he was seen at PCP office earlier in the year with significantly elevated blood pressure.  Benazepril was doubled at that time and he was started on Norvasc 5 mg daily.  Blood pressure did come down, but perhaps too low as he was feeling very weak and dizzy when he stood up.  He stopped Norvasc and has been tracking his blood pressure at home over the last week with systolics in the 546F to 681E.  He is on original dose of benazepril 10 mg daily and plans to follow-up with Dr. Woody Seller for physical with lab work in the next few weeks.  I personally reviewed his ECG today which shows normal sinus rhythm with nonspecific ST changes.  Past Medical History:  Diagnosis Date  . Arthritis   . Carpal tunnel syndrome, bilateral   . Coronary atherosclerosis of native coronary artery    Occluded RCA with collaterals 2004  . Essential hypertension   . Gout   . Mixed hyperlipidemia     Past Surgical History:  Procedure Laterality Date  . CARPAL TUNNEL RELEASE  03/31/2011   Procedure: CARPAL TUNNEL RELEASE;  Surgeon: Cammie Sickle., MD;  Location: [facility];  Service: Orthopedics;  Laterality: Left;  . CARPAL TUNNEL RELEASE  05/17/2011   Procedure: CARPAL TUNNEL RELEASE;  Surgeon: Cammie Sickle., MD;  Location: [facility];  Service: Orthopedics;  Laterality: Right;  . ELBOW SURGERY  2008   right  . PARATHYROIDECTOMY N/A 11/01/2018   Procedure: PARATHYROIDECTOMY;  Surgeon: Armandina Gemma, MD;  [facility];  Service: General;  Laterality: N/A;  . tooth extract     and bone grafts  . TORN LEFT MEDIAL MENISCUS  2008  . TOTAL SHOULDER ARTHROPLASTY Left 03/02/2017   Procedure: LEFT TOTAL SHOULDER ARTHROPLASTY;  Surgeon: Justice Britain, MD;  Location: [facility];  Service: Orthopedics;  Laterality: Left;  . TRIGGER FINGER RELEASE Bilateral    thumbs  . VASECTOMY  1977    Current Outpatient Medications  Medication Sig Dispense Refill  . aspirin EC 81 MG tablet Take 81 mg by mouth daily.    Marland Kitchen atorvastatin (LIPITOR) 40 MG tablet TAKE 1 TABLET BY MOUTH EVERY DAY 90 tablet 1  . benazepril (LOTENSIN) 10 MG tablet TAKE 1 TABLET (10 MG TOTAL) BY MOUTH EVERY EVENING. 90 tablet 3  . diclofenac sodium (VOLTAREN) 1 % GEL Voltaren Gel 3 grams to 3 large joints upto TID 3 TUBES with 3 refills (Patient taking differently: Apply 4 g topically as needed (SHOULDER PAIN).) 3 Tube 3  . famotidine (PEPCID) 20 MG tablet Take 20 mg by mouth daily as needed for heartburn or indigestion.    . metoprolol succinate (TOPROL-XL) 25 MG 24 hr tablet TAKE  1/2 TABLET BY MOUTH EVERY DAY 45 tablet 3  . Multiple Vitamin (MULTIVITAMIN) tablet Take 1 tablet by mouth daily.    . Omega-3 Fatty Acids (FISH OIL) 1000 MG CAPS Take 1 capsule (1,000 mg total) by mouth 2 (two) times daily. (Patient taking differently: Take 1,000 mg by mouth daily.) 60 each 0  . Turmeric 500 MG CAPS Take 1,000 mg by mouth in the morning and at bedtime.    . vitamin C (ASCORBIC ACID) 500 MG tablet Take 500 mg by mouth daily.     No current facility-administered medications for this visit.   Allergies:  Hydrocodone   ROS: No palpitations or syncope.  Physical Exam: VS:  BP (!) 144/88   Pulse 68   Ht 5\' 11"  (1.803 m)   Wt 233 lb (105.7 kg)   SpO2 98%   BMI 32.50  kg/m , BMI Body mass index is 32.5 kg/m.  Wt Readings from Last 3 Encounters:  06/10/20 233 lb (105.7 kg)  12/19/19 226 lb (102.5 kg)  06/11/19 227 lb 9.6 oz (103.2 kg)    General: Patient appears comfortable at rest. HEENT: Conjunctiva and lids normal, wearing a mask. Neck: Supple, no elevated JVP or carotid bruits, no thyromegaly. Lungs: Clear to auscultation, nonlabored breathing at rest. Cardiac: Regular rate and rhythm, no S3 or significant systolic murmur, no pericardial rub. Extremities: No pitting edema.  ECG:  An ECG dated 06/11/2019 was personally reviewed today and demonstrated:  Sinus rhythm with IVCD.  Recent Labwork:    Component Value Date/Time   CHOL 152 12/26/2018 0847   TRIG 196 (H) 12/26/2018 0847   HDL 38 (L) 12/26/2018 0847   CHOLHDL 4.0 12/26/2018 0847   VLDL 39 12/26/2018 0847   LDLCALC 75 12/26/2018 0847    Other Studies Reviewed Today:  Lexiscan Myoview 01/21/2016:  No diagnostic ST segment changes to indicate ischemia.  No significant myocardial perfusion defects to indicate scar or ischemia.  This is a low risk study.  Nuclear stress EF: 58%.  Assessment and Plan:  1.  Symptomatically stable CAD with known occlusion of the RCA associated with collaterals.  He continues to exercise, is trying to work on weight loss.  I reviewed his ECG.  Continue aspirin, Lipitor, Lotensin, and Toprol-XL.  2.  Essential hypertension, medication adjustments made per PCP as noted above.  He felt dizzy with relatively low blood pressures on Norvasc and Lotensin.  I suggested that he track his blood pressure daily and follow-up with Dr. Woody Seller as scheduled in the next few weeks.  Could perhaps consider increasing Lotensin alone if necessary.  3.  Mixed hyperlipidemia, tolerating Lipitor and due for follow-up lab work with Dr. Woody Seller.  Medication Adjustments/Labs and Tests Ordered: Current medicines are reviewed at length with the patient today.  Concerns regarding  medicines are outlined above.   Tests Ordered: Orders Placed This Encounter  Procedures  . EKG 12-Lead    Medication Changes: No orders of the defined types were placed in this encounter.   Disposition:  Follow up 6 months in the [place] office.  Signed, Satira Sark, MD, [facility] 06/10/2020 4:26 PM    [place] at [place], [place], [place] [address] Phone: [phone]; Fax: [phone]

## 2020-06-10 NOTE — Patient Instructions (Addendum)

## 2020-06-17 ENCOUNTER — Ambulatory Visit: Payer: Medicare Other | Admitting: Cardiology

## 2020-06-26 DIAGNOSIS — Z1339 Encounter for screening examination for other mental health and behavioral disorders: Secondary | ICD-10-CM | POA: Diagnosis not present

## 2020-06-26 DIAGNOSIS — E039 Hypothyroidism, unspecified: Secondary | ICD-10-CM | POA: Diagnosis not present

## 2020-06-26 DIAGNOSIS — I10 Essential (primary) hypertension: Secondary | ICD-10-CM | POA: Diagnosis not present

## 2020-06-26 DIAGNOSIS — Z789 Other specified health status: Secondary | ICD-10-CM | POA: Diagnosis not present

## 2020-06-26 DIAGNOSIS — Z125 Encounter for screening for malignant neoplasm of prostate: Secondary | ICD-10-CM | POA: Diagnosis not present

## 2020-06-26 DIAGNOSIS — E7800 Pure hypercholesterolemia, unspecified: Secondary | ICD-10-CM | POA: Diagnosis not present

## 2020-06-26 DIAGNOSIS — Z299 Encounter for prophylactic measures, unspecified: Secondary | ICD-10-CM | POA: Diagnosis not present

## 2020-06-26 DIAGNOSIS — R5383 Other fatigue: Secondary | ICD-10-CM | POA: Diagnosis not present

## 2020-06-26 DIAGNOSIS — Z7189 Other specified counseling: Secondary | ICD-10-CM | POA: Diagnosis not present

## 2020-06-26 DIAGNOSIS — Z1331 Encounter for screening for depression: Secondary | ICD-10-CM | POA: Diagnosis not present

## 2020-06-26 DIAGNOSIS — Z79899 Other long term (current) drug therapy: Secondary | ICD-10-CM | POA: Diagnosis not present

## 2020-06-26 DIAGNOSIS — Z6831 Body mass index (BMI) 31.0-31.9, adult: Secondary | ICD-10-CM | POA: Diagnosis not present

## 2020-06-26 DIAGNOSIS — Z0000 Encounter for general adult medical examination without abnormal findings: Secondary | ICD-10-CM | POA: Diagnosis not present

## 2020-08-08 ENCOUNTER — Other Ambulatory Visit: Payer: Self-pay | Admitting: Cardiology

## 2020-09-01 DIAGNOSIS — Z299 Encounter for prophylactic measures, unspecified: Secondary | ICD-10-CM | POA: Diagnosis not present

## 2020-09-01 DIAGNOSIS — U071 COVID-19: Secondary | ICD-10-CM | POA: Diagnosis not present

## 2020-09-01 DIAGNOSIS — I10 Essential (primary) hypertension: Secondary | ICD-10-CM | POA: Diagnosis not present

## 2020-12-17 NOTE — Progress Notes (Signed)
Cardiology Office Note  Date: 12/18/2020   ID: [facility] Ricky Wilson, Ricky Wilson 1955/11/30, MRN [id number]  PCP:  Glenda Chroman, MD  Cardiologist:  Rozann Lesches, MD Electrophysiologist:  None   Chief Complaint  Patient presents with   Cardiac follow-up    History of Present Illness: Ricky Wilson is a 65 y.o. male last seen in March.  He is here for a routine visit.  Reports doing well, no angina symptoms or change in stamina.  He is exercising 4 days a week.  I reviewed his medications which are noted below.  He reports no intolerances.  Recent LDL was 70 in April.  I did talk with him about SGLT2 inhibitors for further cardiac risk reduction.  Blood pressure elevated today, he does have some component of whitecoat hypertension.  He checks his blood pressure at home, recent measurement 128/66.  Past Medical History:  Diagnosis Date   Arthritis    Carpal tunnel syndrome, bilateral    Coronary atherosclerosis of native coronary artery    Occluded RCA with collaterals 2004   Essential hypertension    Gout    Mixed hyperlipidemia     Past Surgical History:  Procedure Laterality Date   CARPAL TUNNEL RELEASE  03/31/2011   Procedure: CARPAL TUNNEL RELEASE;  Surgeon: Cammie Sickle., MD;  Location: [place];  Service: Orthopedics;  Laterality: Left;   CARPAL TUNNEL RELEASE  05/17/2011   Procedure: CARPAL TUNNEL RELEASE;  Surgeon: Cammie Sickle., MD;  Location: [place];  Service: Orthopedics;  Laterality: Right;   ELBOW SURGERY  2008   right   PARATHYROIDECTOMY N/A 11/01/2018   Procedure: PARATHYROIDECTOMY;  Surgeon: Armandina Gemma, MD;  [facility];  Service: General;  Laterality: N/A;   tooth extract     and bone grafts   TORN LEFT MEDIAL MENISCUS  2008   TOTAL SHOULDER ARTHROPLASTY Left 03/02/2017   Procedure: LEFT TOTAL SHOULDER ARTHROPLASTY;  Surgeon: Justice Britain, MD;  Location: [place];  Service: Orthopedics;  Laterality:  Left;   TRIGGER FINGER RELEASE Bilateral    thumbs   VASECTOMY  1977    Current Outpatient Medications  Medication Sig Dispense Refill   aspirin EC 81 MG tablet Take 81 mg by mouth daily.     atorvastatin (LIPITOR) 40 MG tablet TAKE 1 TABLET BY MOUTH EVERY DAY 90 tablet 3   benazepril (LOTENSIN) 10 MG tablet TAKE 1 TABLET (10 MG TOTAL) BY MOUTH EVERY EVENING. 90 tablet 3   diclofenac sodium (VOLTAREN) 1 % GEL Voltaren Gel 3 grams to 3 large joints upto TID 3 TUBES with 3 refills (Patient taking differently: Apply 4 g topically as needed (SHOULDER PAIN).) 3 Tube 3   famotidine (PEPCID) 20 MG tablet Take 20 mg by mouth daily as needed for heartburn or indigestion.     metoprolol succinate (TOPROL-XL) 25 MG 24 hr tablet TAKE 1/2 TABLET BY MOUTH DAILY 45 tablet 3   Multiple Vitamin (MULTIVITAMIN) tablet Take 1 tablet by mouth daily.     Omega-3 Fatty Acids (FISH OIL) 1000 MG CAPS Take 1 capsule (1,000 mg total) by mouth 2 (two) times daily. (Patient taking differently: Take 1,000 mg by mouth daily.) 60 each 0   Turmeric 500 MG CAPS Take 1,000 mg by mouth in the morning and at bedtime.     vitamin C (ASCORBIC ACID) 500 MG tablet Take 500 mg by mouth daily.     No current facility-administered medications  for this visit.   Allergies:  Hydrocodone   ROS: No palpitations or syncope.  Physical Exam: VS:  BP (!) 150/70   Pulse 78   Ht 5\' 11"  (1.803 m)   Wt 230 lb 9.6 oz (104.6 kg)   SpO2 98%   BMI 32.16 kg/m , BMI Body mass index is 32.16 kg/m.  Wt Readings from Last 3 Encounters:  12/18/20 230 lb 9.6 oz (104.6 kg)  06/10/20 233 lb (105.7 kg)  12/19/19 226 lb (102.5 kg)    General: Patient appears comfortable at rest. HEENT: Conjunctiva and lids normal, wearing a mask. Neck: Supple, no elevated JVP or carotid bruits, no thyromegaly. Lungs: Clear to auscultation, nonlabored breathing at rest. Cardiac: Regular rate and rhythm, no S3 or significant systolic murmur, no pericardial  rub. Extremities: No pitting edema.  ECG:  An ECG dated 06/10/2020 was personally reviewed today and demonstrated:  Sinus rhythm with nonspecific ST changes.  Recent Labwork:    Component Value Date/Time   CHOL 152 12/26/2018 0847   TRIG 196 (H) 12/26/2018 0847   HDL 38 (L) 12/26/2018 0847   CHOLHDL 4.0 12/26/2018 0847   VLDL 39 12/26/2018 0847   LDLCALC 75 12/26/2018 0847  April 2022: Hemoglobin 14.1, platelets 172, BUN 11, creatinine 1.2, potassium 4.5, AST 29, ALT 28, cholesterol 138, triglycerides 171, HDL 39, LDL 70, TSH 4.84  Other Studies Reviewed Today:  Lexiscan Myoview 01/21/2016: No diagnostic ST segment changes to indicate ischemia. No significant myocardial perfusion defects to indicate scar or ischemia. This is a low risk study. Nuclear stress EF: 58%.  Assessment and Plan:  1.  CAD with known occlusion of the RCA associated with collaterals.  He remains symptomatically stable without active angina, exercising regularly.  Continue aspirin, Lipitor, Toprol-XL, and Lotensin.  I did talk with him about possibility of adding SGLT2 inhibitor such as Jardiance for further cardiac risk reduction.  He wanted to think about this and do some research first.  2.  Mixed hyperlipidemia, on Lipitor.  Recent LDL 70.  3.  Essential hypertension and whitecoat hypertension.  Currently on Lotensin and Toprol-XL.  Continue to track blood pressures at home, if trend increases could consider further up titration of therapy.  Also continue exercise.  Medication Adjustments/Labs and Tests Ordered: Current medicines are reviewed at length with the patient today.  Concerns regarding medicines are outlined above.   Tests Ordered: No orders of the defined types were placed in this encounter.   Medication Changes: No orders of the defined types were placed in this encounter.   Disposition:  Follow up  6 months.  Signed, Satira Sark, MD, [facility] 12/18/2020 8:42 AM    [facility] [place], [place], [place] [address] Phone: [phone]; Fax: [phone]

## 2020-12-18 ENCOUNTER — Ambulatory Visit: Payer: Medicare Other | Admitting: Cardiology

## 2020-12-18 ENCOUNTER — Encounter: Payer: Self-pay | Admitting: Cardiology

## 2020-12-18 VITALS — BP 150/70 | HR 78 | Ht 71.0 in | Wt 230.6 lb

## 2020-12-18 DIAGNOSIS — I10 Essential (primary) hypertension: Secondary | ICD-10-CM | POA: Diagnosis not present

## 2020-12-18 DIAGNOSIS — E782 Mixed hyperlipidemia: Secondary | ICD-10-CM

## 2020-12-18 DIAGNOSIS — I25119 Atherosclerotic heart disease of native coronary artery with unspecified angina pectoris: Secondary | ICD-10-CM

## 2020-12-18 NOTE — Patient Instructions (Addendum)
Medication Instructions:  Continue all current medications.   Labwork: none  Testing/Procedures: none  Follow-Up: 6 months   Any Other Special Instructions Will Be Listed Below (If Applicable).   If you need a refill on your cardiac medications before your next appointment, please call your pharmacy.  

## 2021-01-19 DIAGNOSIS — M722 Plantar fascial fibromatosis: Secondary | ICD-10-CM | POA: Diagnosis not present

## 2021-01-25 DIAGNOSIS — I10 Essential (primary) hypertension: Secondary | ICD-10-CM | POA: Diagnosis not present

## 2021-02-10 IMAGING — US US THYROID
1 series · 13 of 25 positions shown · non-contrast
Comparison: Scintigraphy 11/28/2014

CLINICAL DATA: Thyroid enlargement, high calcium

EXAM:
THYROID ULTRASOUND
TECHNIQUE: Ultrasound examination of the thyroid gland and adjacent soft
tissues was performed.

[Series 1: us thyroid · 0.05mm/px · 59 acquisitions, 13 frames shown]
[im 1/59]
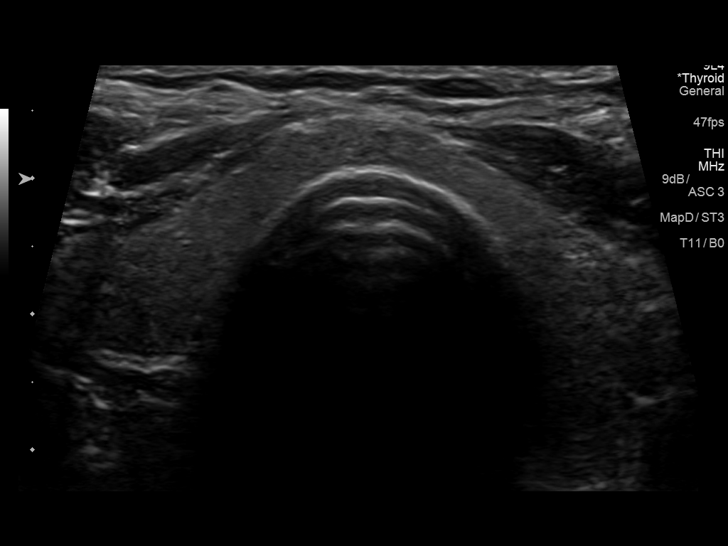
[im 5/59]
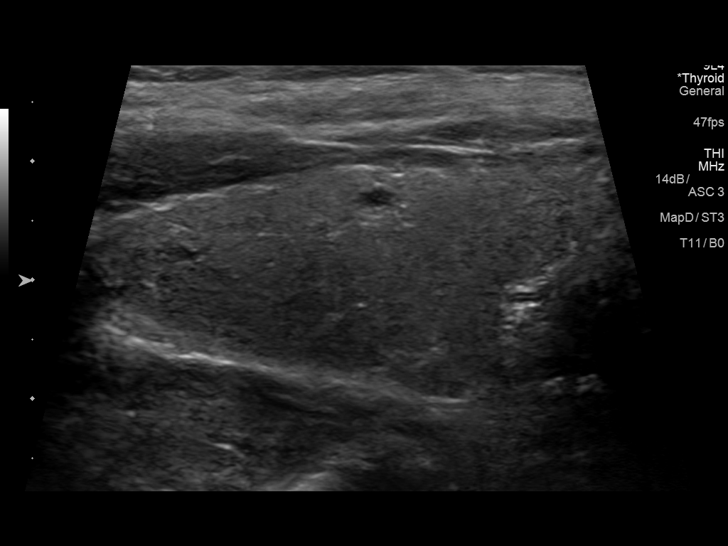
[im 10/59]
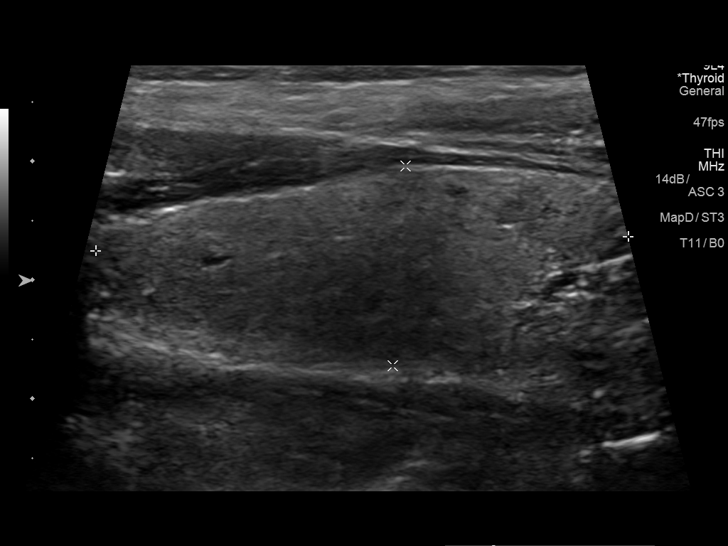
[im 15/59]
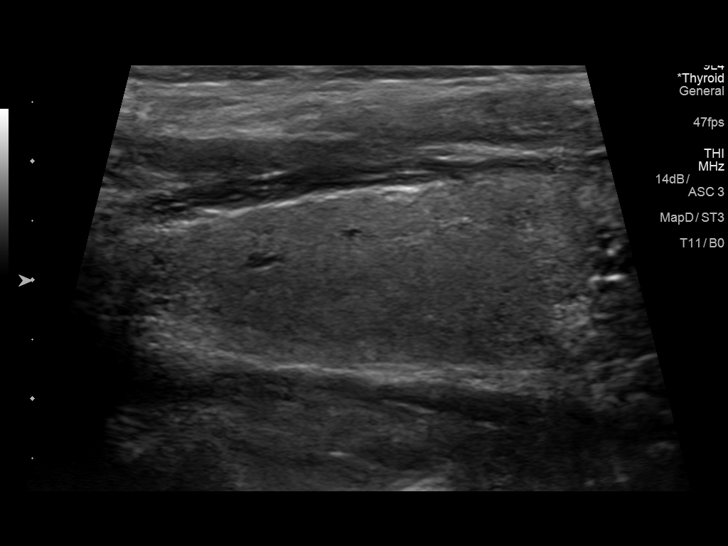
[im 20/59]
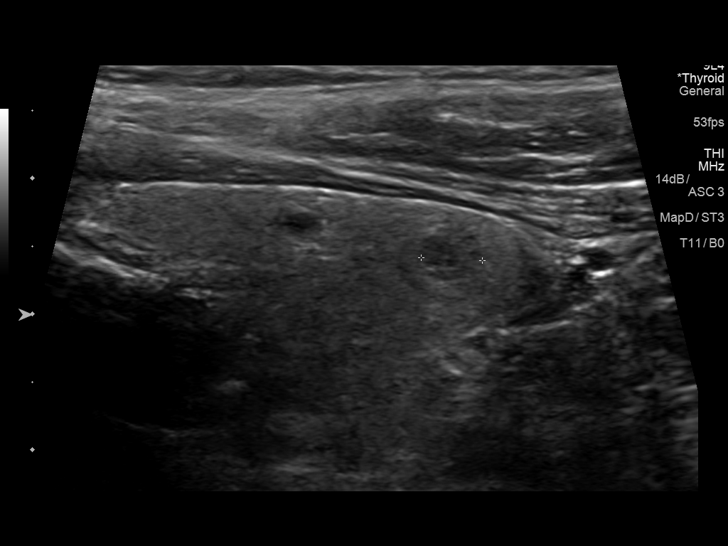
[im 25/59]
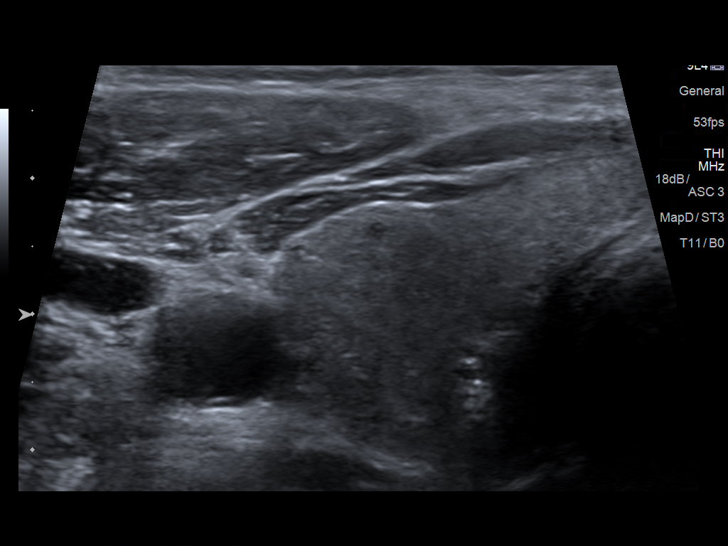
[im 30/59]
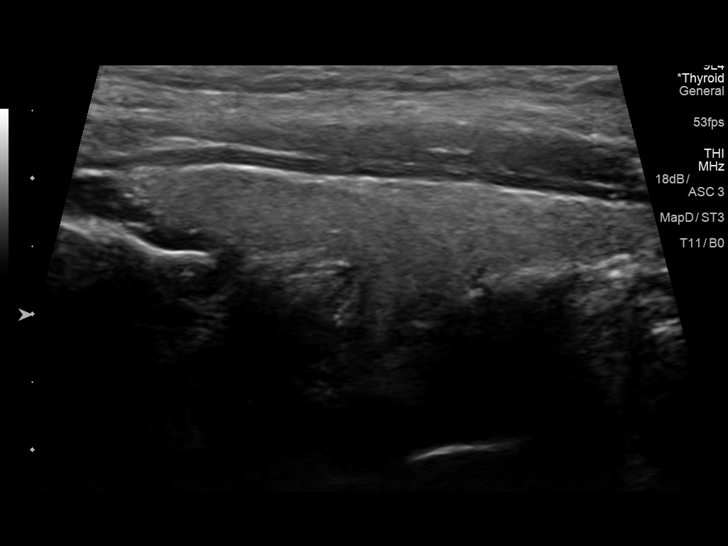
[im 34/59]
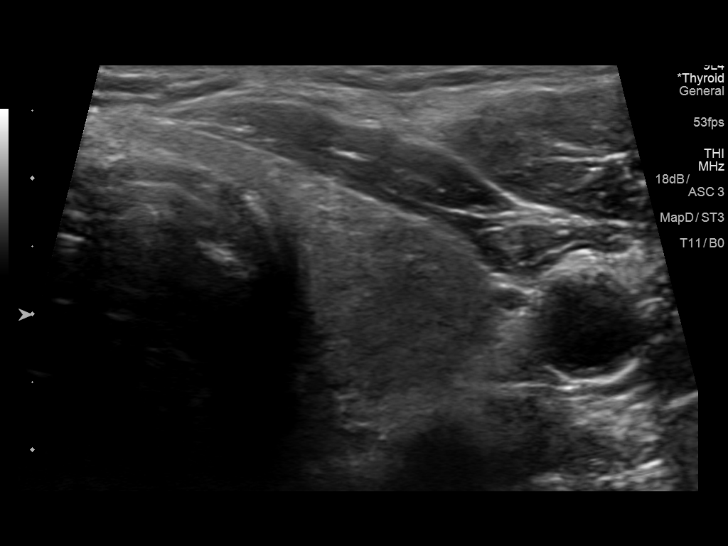
[im 39/59]
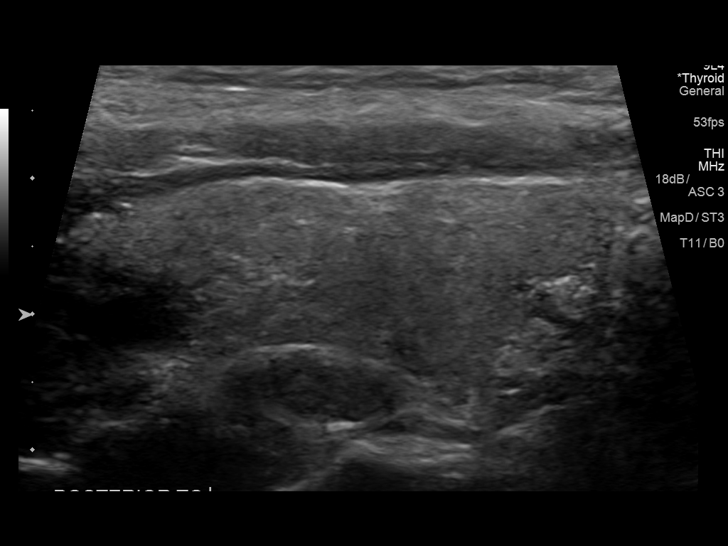
[im 44/59]
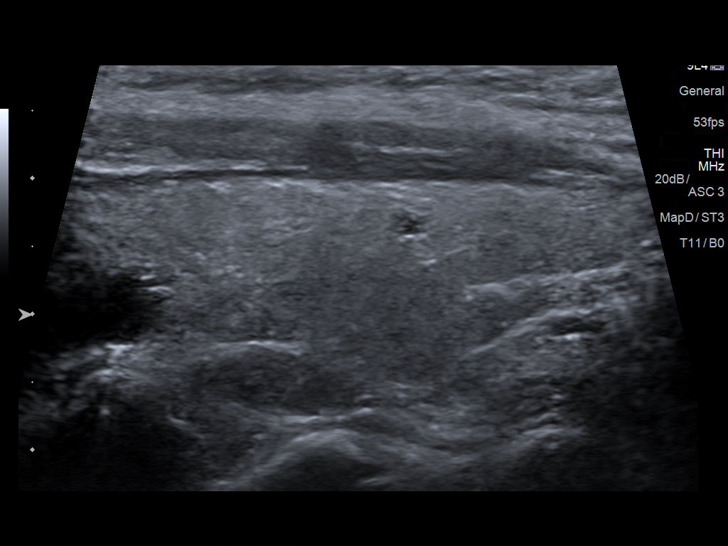
[im 49/59]
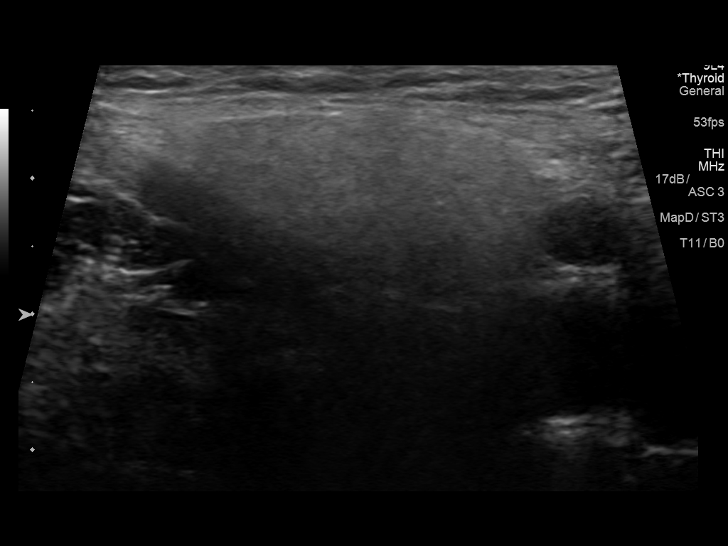
[im 54/59]
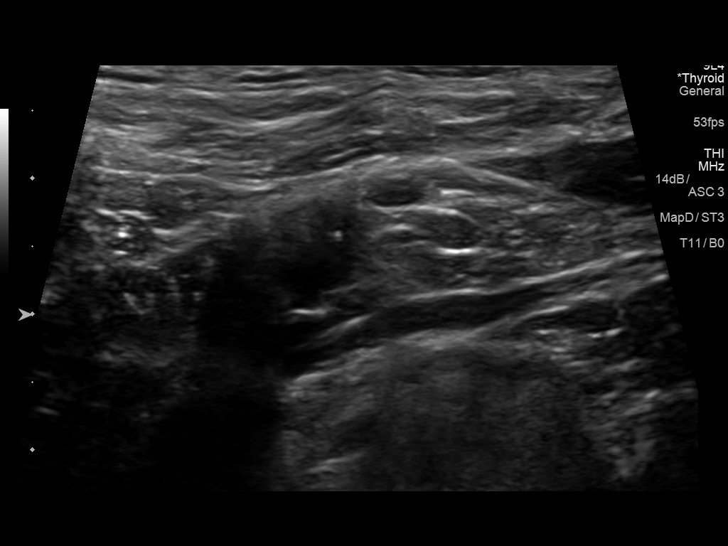
[im 59/59]
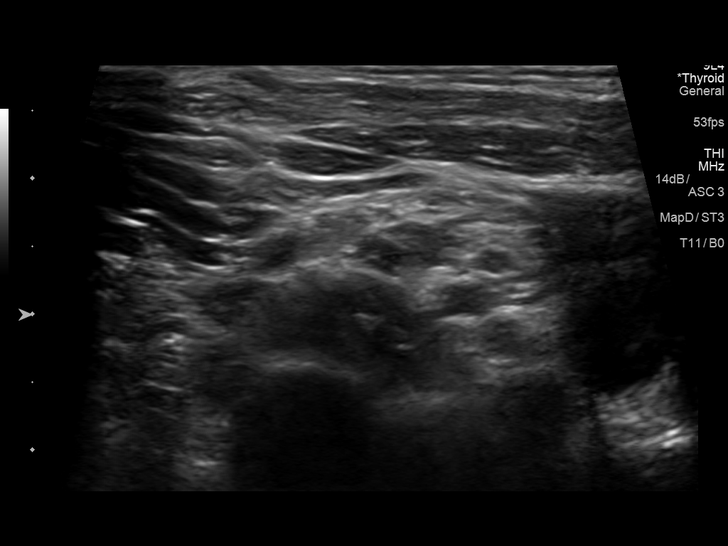

[13 of 25 positions shown; findings below may reference images not displayed]

FINDINGS: Parenchymal Echotexture: Mildly heterogenous

Isthmus: 0.4 cm thickness

Right lobe: 4.5 x 1.7 x 1.8 cm

Left lobe: 4.1 x 1.3 x 1.4 cm

_________________________________________________________

Estimated total number of nodules >/= 1 cm: 0

Number of spongiform nodules >/=  2 cm not described below (TR1): 0

Number of mixed cystic and solid nodules >/= 1.5 cm not described
below (TR2): 0

_________________________________________________________

Scattered hypoechoic foci in the right lobe less than 0.5 cm.

1.2 x 1.2 x 0.5 cm slightly hypoechoic nodule posterior to the left
thyroid lobe.
IMPRESSION: 1. Normal-sized thyroid with no significant nodule, or other
indication for biopsy or dedicated imaging follow-up.
2. 1.2 cm nodule posterior to left thyroid lobe, suggesting possible
parathyroid adenoma. Parathyroid scintigraphy may be useful for
further evaluation.

The above is in keeping with the ACR TI-RADS recommendations - [HOSPITAL] 9660;[DATE].

## 2021-02-16 DIAGNOSIS — M722 Plantar fascial fibromatosis: Secondary | ICD-10-CM | POA: Diagnosis not present

## 2021-02-24 DIAGNOSIS — I10 Essential (primary) hypertension: Secondary | ICD-10-CM | POA: Diagnosis not present

## 2021-03-02 DIAGNOSIS — I10 Essential (primary) hypertension: Secondary | ICD-10-CM | POA: Diagnosis not present

## 2021-03-02 DIAGNOSIS — R6884 Jaw pain: Secondary | ICD-10-CM | POA: Diagnosis not present

## 2021-03-02 DIAGNOSIS — M545 Low back pain, unspecified: Secondary | ICD-10-CM | POA: Diagnosis not present

## 2021-03-02 DIAGNOSIS — Z299 Encounter for prophylactic measures, unspecified: Secondary | ICD-10-CM | POA: Diagnosis not present

## 2021-03-09 DIAGNOSIS — L570 Actinic keratosis: Secondary | ICD-10-CM | POA: Diagnosis not present

## 2021-03-26 DIAGNOSIS — I10 Essential (primary) hypertension: Secondary | ICD-10-CM | POA: Diagnosis not present

## 2021-04-25 DIAGNOSIS — I10 Essential (primary) hypertension: Secondary | ICD-10-CM | POA: Diagnosis not present

## 2021-05-07 DIAGNOSIS — H43393 Other vitreous opacities, bilateral: Secondary | ICD-10-CM | POA: Diagnosis not present

## 2021-05-25 DIAGNOSIS — I10 Essential (primary) hypertension: Secondary | ICD-10-CM | POA: Diagnosis not present

## 2021-06-16 DIAGNOSIS — Z299 Encounter for prophylactic measures, unspecified: Secondary | ICD-10-CM | POA: Diagnosis not present

## 2021-06-16 DIAGNOSIS — I10 Essential (primary) hypertension: Secondary | ICD-10-CM | POA: Diagnosis not present

## 2021-06-16 DIAGNOSIS — Z789 Other specified health status: Secondary | ICD-10-CM | POA: Diagnosis not present

## 2021-06-16 DIAGNOSIS — S39012A Strain of muscle, fascia and tendon of lower back, initial encounter: Secondary | ICD-10-CM | POA: Diagnosis not present

## 2021-06-24 DIAGNOSIS — I10 Essential (primary) hypertension: Secondary | ICD-10-CM | POA: Diagnosis not present

## 2021-06-24 NOTE — Progress Notes (Signed)
? ? ?Cardiology Office Note ? ?Date: 06/25/2021  ? ?ID: [facility], [place] [birth year], MRN [id number] ? ?PCP:  Glenda Chroman, MD  ?Cardiologist:  Rozann Lesches, MD ?Electrophysiologist:  None  ? ?Chief Complaint  ?Patient presents with  ? Cardiac follow-up  ? ? ?History of Present Illness: ?Ricky Wilson is a 66 y.o. male last seen in September 2022.  He is here for a routine visit.  Still exercising 5 days a week, no angina symptoms and NYHA class I dyspnea.  Recently strained his back working outside, but getting better on steroids per PCP.  He will be having a follow-up physical with PCP in the near future with lab work. ? ?I personally reviewed his ECG today which shows normal sinus rhythm.  I also went over his medications which are noted below.  He is now on Norvasc providing better control of his blood pressure.  He is tolerating this well. ? ?Past Medical History:  ?Diagnosis Date  ? Arthritis   ? Carpal tunnel syndrome, bilateral   ? Coronary atherosclerosis of native coronary artery   ? Occluded RCA with collaterals 2004  ? Essential hypertension   ? Gout   ? Mixed hyperlipidemia   ? ? ?Past Surgical History:  ?Procedure Laterality Date  ? CARPAL TUNNEL RELEASE  03/31/2011  ? Procedure: CARPAL TUNNEL RELEASE;  Surgeon: Cammie Sickle., MD;  Location: [place];  Service: Orthopedics;  Laterality: Left;  ? CARPAL TUNNEL RELEASE  05/17/2011  ? Procedure: CARPAL TUNNEL RELEASE;  Surgeon: Cammie Sickle., MD;  Location: [place];  Service: Orthopedics;  Laterality: Right;  ? ELBOW SURGERY  2008  ? right  ? PARATHYROIDECTOMY N/A 11/01/2018  ? Procedure: PARATHYROIDECTOMY;  Surgeon: Armandina Gemma, MD;  [facility];  Service: General;  Laterality: N/A;  ? tooth extract    ? and bone grafts  ? TORN LEFT MEDIAL MENISCUS  2008  ? TOTAL SHOULDER ARTHROPLASTY Left 03/02/2017  ? Procedure: LEFT TOTAL SHOULDER ARTHROPLASTY;  Surgeon: Justice Britain, MD;  Location: [place];  Service: Orthopedics;  Laterality: Left;  ? TRIGGER FINGER RELEASE Bilateral   ? thumbs  ? VASECTOMY  1977  ? ? ?Current Outpatient Medications  ?Medication Sig Dispense Refill  ? amLODipine (NORVASC) 5 MG tablet Take 5 mg by mouth daily.    ? aspirin EC 81 MG tablet Take 81 mg by mouth daily.    ? atorvastatin (LIPITOR) 40 MG tablet TAKE 1 TABLET BY MOUTH EVERY DAY 90 tablet 3  ? benazepril (LOTENSIN) 10 MG tablet TAKE 1 TABLET (10 MG TOTAL) BY MOUTH EVERY EVENING. 90 tablet 3  ? diclofenac sodium (VOLTAREN) 1 % GEL Voltaren Gel 3 grams to 3 large joints upto TID 3 TUBES with 3 refills (Patient taking differently: Apply 4 g topically as needed (SHOULDER PAIN).) 3 Tube 3  ? famotidine (PEPCID) 20 MG tablet Take 20 mg by mouth daily as needed for heartburn or indigestion.    ? metoprolol succinate (TOPROL-XL) 25 MG 24 hr tablet TAKE 1/2 TABLET BY MOUTH DAILY 45 tablet 3  ? Multiple Vitamin (MULTIVITAMIN) tablet Take 1 tablet by mouth daily.    ? Omega-3 Fatty Acids (FISH OIL) 1000 MG CAPS Take 1 capsule (1,000 mg total) by mouth 2 (two) times daily. (Patient taking differently: Take 1,000 mg by mouth daily.) 60 each 0  ? Turmeric 500 MG CAPS Take 1,000 mg by mouth in the morning and at  bedtime.    ? vitamin C (ASCORBIC ACID) 500 MG tablet Take 500 mg by mouth daily.    ? ?No current facility-administered medications for this visit.  ? ?Allergies:  Hydrocodone  ? ?ROS: No palpitations or syncope. ? ?Physical Exam: ?VS:  BP 138/68   Pulse 74   Ht '5\' 11"'$  (1.803 m)   Wt 225 lb (102.1 kg)   SpO2 98%   BMI 31.38 kg/m? , BMI Body mass index is 31.38 kg/m?. ? ?Wt Readings from Last 3 Encounters:  ?06/25/21 225 lb (102.1 kg)  ?12/18/20 230 lb 9.6 oz (104.6 kg)  ?06/10/20 233 lb (105.7 kg)  ?  ?General: Patient appears comfortable at rest. ?HEENT: Conjunctiva and lids normal. ?Neck: Supple, no elevated JVP or carotid bruits, no thyromegaly. ?Lungs: Clear to auscultation, nonlabored breathing at rest. ?Cardiac:  Regular rate and rhythm, no S3 or significant systolic murmur, no pericardial rub. ?Extremities: No pitting edema. ? ?ECG:  An ECG dated 06/10/2020 was personally reviewed today and demonstrated:  Sinus rhythm with nonspecific ST changes. ? ?Recent Labwork: ? ?April 2022: Hemoglobin 14.1, platelets 172, BUN 11, creatinine 1.2, potassium 4.5, AST 29, ALT 28, cholesterol 138, triglycerides 171, HDL 39, LDL 70, TSH 4.84 ? ?Other Studies Reviewed Today: ? ?Lexiscan Myoview 01/21/2016: ?No diagnostic ST segment changes to indicate ischemia. ?No significant myocardial perfusion defects to indicate scar or ischemia. ?This is a low risk study. ?Nuclear stress EF: 58%. ? ?Assessment and Plan: ? ?1.  CAD with occluded RCA associated with collaterals, continues to do well without significant angina on medical therapy.  ECG is normal today.  Continue regular exercise plan and observation.  He is on aspirin, Toprol-XL, Lotensin, Norvasc, and Lipitor. ? ?2.  Essential hypertension, Norvasc added by PCP since last assessment and blood pressure trend better today.  No changes were made. ? ?3.  Mixed hyperlipidemia, continues on Lipitor and pending lab work with PCP.  His last LDL was 70. ? ?Medication Adjustments/Labs and Tests Ordered: ?Current medicines are reviewed at length with the patient today.  Concerns regarding medicines are outlined above.  ? ?Tests Ordered: ?No orders of the defined types were placed in this encounter. ? ? ?Medication Changes: ?No orders of the defined types were placed in this encounter. ? ? ?Disposition:  Follow up  6 months. ? ?Signed, ?Satira Sark, MD, [facility] ?06/25/2021 8:59 AM    ?[place] [facility] ?[place], [place],  [address] ?Phone: [phone]; Fax: [phone]  ?

## 2021-06-25 ENCOUNTER — Encounter: Payer: Self-pay | Admitting: Cardiology

## 2021-06-25 ENCOUNTER — Ambulatory Visit (INDEPENDENT_AMBULATORY_CARE_PROVIDER_SITE_OTHER): Payer: Medicare Other | Admitting: Cardiology

## 2021-06-25 VITALS — BP 138/68 | HR 74 | Ht 71.0 in | Wt 225.0 lb

## 2021-06-25 DIAGNOSIS — I25119 Atherosclerotic heart disease of native coronary artery with unspecified angina pectoris: Secondary | ICD-10-CM | POA: Diagnosis not present

## 2021-06-25 DIAGNOSIS — I10 Essential (primary) hypertension: Secondary | ICD-10-CM

## 2021-06-25 DIAGNOSIS — E782 Mixed hyperlipidemia: Secondary | ICD-10-CM

## 2021-06-25 NOTE — Patient Instructions (Signed)
Medication Instructions:  Continue all current medications.   Labwork: none  Testing/Procedures: none  Follow-Up: 6 months   Any Other Special Instructions Will Be Listed Below (If Applicable).   If you need a refill on your cardiac medications before your next appointment, please call your pharmacy.  

## 2021-06-25 NOTE — Addendum Note (Signed)
Addended by: Levonne Hubert on: 06/25/2021 09:07 AM ? ? Modules accepted: Orders ? ?

## 2021-06-29 DIAGNOSIS — R5383 Other fatigue: Secondary | ICD-10-CM | POA: Diagnosis not present

## 2021-06-29 DIAGNOSIS — Z299 Encounter for prophylactic measures, unspecified: Secondary | ICD-10-CM | POA: Diagnosis not present

## 2021-06-29 DIAGNOSIS — E039 Hypothyroidism, unspecified: Secondary | ICD-10-CM | POA: Diagnosis not present

## 2021-06-29 DIAGNOSIS — I10 Essential (primary) hypertension: Secondary | ICD-10-CM | POA: Diagnosis not present

## 2021-06-29 DIAGNOSIS — Z79899 Other long term (current) drug therapy: Secondary | ICD-10-CM | POA: Diagnosis not present

## 2021-06-29 DIAGNOSIS — Z0000 Encounter for general adult medical examination without abnormal findings: Secondary | ICD-10-CM | POA: Diagnosis not present

## 2021-06-29 DIAGNOSIS — Z7189 Other specified counseling: Secondary | ICD-10-CM | POA: Diagnosis not present

## 2021-06-29 DIAGNOSIS — E7800 Pure hypercholesterolemia, unspecified: Secondary | ICD-10-CM | POA: Diagnosis not present

## 2021-06-30 DIAGNOSIS — S233XXA Sprain of ligaments of thoracic spine, initial encounter: Secondary | ICD-10-CM | POA: Diagnosis not present

## 2021-06-30 DIAGNOSIS — M47816 Spondylosis without myelopathy or radiculopathy, lumbar region: Secondary | ICD-10-CM | POA: Diagnosis not present

## 2021-06-30 DIAGNOSIS — S335XXA Sprain of ligaments of lumbar spine, initial encounter: Secondary | ICD-10-CM | POA: Diagnosis not present

## 2021-06-30 DIAGNOSIS — M9902 Segmental and somatic dysfunction of thoracic region: Secondary | ICD-10-CM | POA: Diagnosis not present

## 2021-06-30 DIAGNOSIS — M9903 Segmental and somatic dysfunction of lumbar region: Secondary | ICD-10-CM | POA: Diagnosis not present

## 2021-07-01 DIAGNOSIS — M9902 Segmental and somatic dysfunction of thoracic region: Secondary | ICD-10-CM | POA: Diagnosis not present

## 2021-07-01 DIAGNOSIS — M47816 Spondylosis without myelopathy or radiculopathy, lumbar region: Secondary | ICD-10-CM | POA: Diagnosis not present

## 2021-07-01 DIAGNOSIS — S335XXA Sprain of ligaments of lumbar spine, initial encounter: Secondary | ICD-10-CM | POA: Diagnosis not present

## 2021-07-01 DIAGNOSIS — M9903 Segmental and somatic dysfunction of lumbar region: Secondary | ICD-10-CM | POA: Diagnosis not present

## 2021-07-01 DIAGNOSIS — S233XXA Sprain of ligaments of thoracic spine, initial encounter: Secondary | ICD-10-CM | POA: Diagnosis not present

## 2021-07-14 DIAGNOSIS — H524 Presbyopia: Secondary | ICD-10-CM | POA: Diagnosis not present

## 2021-07-25 DIAGNOSIS — I10 Essential (primary) hypertension: Secondary | ICD-10-CM | POA: Diagnosis not present

## 2021-08-01 ENCOUNTER — Other Ambulatory Visit: Payer: Self-pay | Admitting: Cardiology

## 2021-08-02 DIAGNOSIS — M109 Gout, unspecified: Secondary | ICD-10-CM | POA: Diagnosis not present

## 2021-08-02 DIAGNOSIS — R5383 Other fatigue: Secondary | ICD-10-CM | POA: Diagnosis not present

## 2021-08-02 DIAGNOSIS — Z299 Encounter for prophylactic measures, unspecified: Secondary | ICD-10-CM | POA: Diagnosis not present

## 2021-08-02 DIAGNOSIS — E7800 Pure hypercholesterolemia, unspecified: Secondary | ICD-10-CM | POA: Diagnosis not present

## 2021-08-05 DIAGNOSIS — Z713 Dietary counseling and surveillance: Secondary | ICD-10-CM | POA: Diagnosis not present

## 2021-08-05 DIAGNOSIS — M109 Gout, unspecified: Secondary | ICD-10-CM | POA: Diagnosis not present

## 2021-08-05 DIAGNOSIS — Z299 Encounter for prophylactic measures, unspecified: Secondary | ICD-10-CM | POA: Diagnosis not present

## 2021-08-05 DIAGNOSIS — I10 Essential (primary) hypertension: Secondary | ICD-10-CM | POA: Diagnosis not present

## 2021-08-24 DIAGNOSIS — I10 Essential (primary) hypertension: Secondary | ICD-10-CM | POA: Diagnosis not present

## 2021-09-23 DIAGNOSIS — I10 Essential (primary) hypertension: Secondary | ICD-10-CM | POA: Diagnosis not present

## 2021-10-25 DIAGNOSIS — I10 Essential (primary) hypertension: Secondary | ICD-10-CM | POA: Diagnosis not present

## 2021-11-09 DIAGNOSIS — Z0000 Encounter for general adult medical examination without abnormal findings: Secondary | ICD-10-CM | POA: Diagnosis not present

## 2021-11-09 DIAGNOSIS — R5383 Other fatigue: Secondary | ICD-10-CM | POA: Diagnosis not present

## 2021-11-09 DIAGNOSIS — E7800 Pure hypercholesterolemia, unspecified: Secondary | ICD-10-CM | POA: Diagnosis not present

## 2021-11-09 DIAGNOSIS — I10 Essential (primary) hypertension: Secondary | ICD-10-CM | POA: Diagnosis not present

## 2021-11-09 DIAGNOSIS — Z299 Encounter for prophylactic measures, unspecified: Secondary | ICD-10-CM | POA: Diagnosis not present

## 2021-11-09 DIAGNOSIS — E039 Hypothyroidism, unspecified: Secondary | ICD-10-CM | POA: Diagnosis not present

## 2021-11-24 DIAGNOSIS — I10 Essential (primary) hypertension: Secondary | ICD-10-CM | POA: Diagnosis not present

## 2021-12-24 DIAGNOSIS — I10 Essential (primary) hypertension: Secondary | ICD-10-CM | POA: Diagnosis not present

## 2021-12-26 NOTE — Progress Notes (Unsigned)
Cardiology Office Note  Date: 12/27/2021   ID: Ricky Wilson, Ricky Wilson Mar 17, 1956, MRN [id number]  PCP:  Glenda Chroman, MD  Cardiologist:  Rozann Lesches, MD Electrophysiologist:  None   Chief Complaint  Patient presents with   Cardiac follow-up    History of Present Illness: Ricky Wilson is a 65 y.o. male last seen in March.  He is here for a routine visit.  Reports no angina at this time, still exercising 5 days a week at Ricky gym.  Also enjoys working outdoors.  He does not indicate any change in stamina, no palpitations or syncope.  I reviewed his medications which are stable from a cardiac perspective and outlined below.  I reviewed his most recent lab work from physical earlier in Ricky year.  He continues to track blood pressure at home, generally systolics are in Ricky 462V to 130s.  Past Medical History:  Diagnosis Date   Arthritis    Carpal tunnel syndrome, bilateral    Coronary atherosclerosis of native coronary artery    Occluded RCA with collaterals 2004   Essential hypertension    Gout    Mixed hyperlipidemia     Past Surgical History:  Procedure Laterality Date   CARPAL TUNNEL RELEASE  03/31/2011   Procedure: CARPAL TUNNEL RELEASE;  Surgeon: Cammie Sickle., MD;  Location: [place];  Service: Orthopedics;  Laterality: Left;   CARPAL TUNNEL RELEASE  05/17/2011   Procedure: CARPAL TUNNEL RELEASE;  Surgeon: Cammie Sickle., MD;  Location: [place];  Service: Orthopedics;  Laterality: Right;   ELBOW SURGERY  2008   right   PARATHYROIDECTOMY N/A 11/01/2018   Procedure: PARATHYROIDECTOMY;  Surgeon: Armandina Gemma, MD;  [facility];  Service: General;  Laterality: N/A;   tooth extract     and bone grafts   TORN LEFT MEDIAL MENISCUS  2008   TOTAL SHOULDER ARTHROPLASTY Left 03/02/2017   Procedure: LEFT TOTAL SHOULDER ARTHROPLASTY;  Surgeon: Justice Britain, MD;  Location: [place];  Service: Orthopedics;  Laterality:  Left;   TRIGGER FINGER RELEASE Bilateral    thumbs   VASECTOMY  1977    Current Outpatient Medications  Medication Sig Dispense Refill   amLODipine (NORVASC) 5 MG tablet Take 5 mg by mouth daily.     aspirin EC 81 MG tablet Take 81 mg by mouth daily.     atorvastatin (LIPITOR) 40 MG tablet TAKE 1 TABLET BY MOUTH EVERY DAY 90 tablet 3   benazepril (LOTENSIN) 10 MG tablet TAKE 1 TABLET (10 MG TOTAL) BY MOUTH EVERY EVENING. 90 tablet 3   diclofenac sodium (VOLTAREN) 1 % GEL Voltaren Gel 3 grams to 3 large joints upto TID 3 TUBES with 3 refills (Patient taking differently: Apply 4 g topically as needed (SHOULDER PAIN).) 3 Tube 3   famotidine (PEPCID) 20 MG tablet Take 20 mg by mouth daily as needed for heartburn or indigestion.     metoprolol succinate (TOPROL-XL) 25 MG 24 hr tablet TAKE 1/2 TABLET BY MOUTH DAILY 45 tablet 3   Multiple Vitamin (MULTIVITAMIN) tablet Take 1 tablet by mouth daily.     Omega-3 Fatty Acids (FISH OIL) 1000 MG CAPS Take 1 capsule (1,000 mg total) by mouth 2 (two) times daily. (Patient taking differently: Take 1,000 mg by mouth daily.) 60 each 0   Turmeric 500 MG CAPS Take 1,000 mg by mouth in Ricky morning and at bedtime.     vitamin C (ASCORBIC ACID)  500 MG tablet Take 500 mg by mouth daily.     No current facility-administered medications for this visit.   Allergies:  Hydrocodone   ROS: No syncope.  Physical Exam: VS:  BP 138/70   Pulse 67   Ht '5\' 11"'$  (1.803 m)   Wt 231 lb (104.8 kg)   SpO2 97%   BMI 32.22 kg/m , BMI Body mass index is 32.22 kg/m.  Wt Readings from Last 3 Encounters:  12/27/21 231 lb (104.8 kg)  06/25/21 225 lb (102.1 kg)  12/18/20 230 lb 9.6 oz (104.6 kg)    General: Patient appears comfortable at rest. HEENT: Conjunctiva and lids normal. Neck: Supple, no elevated JVP or carotid bruits. Lungs: Clear to auscultation, nonlabored breathing at rest. Cardiac: Regular rate and rhythm, no S3 or significant systolic murmur. Extremities:  No pitting edema.  ECG:  An ECG dated 06/25/2021 was personally reviewed today and demonstrated:  Sinus rhythm.  Recent Labwork:  April 2023: Hemoglobin 14.3, platelets 169, BUN 17, creatinine 1.3, potassium 4.3, AST 18, ALT 24, cholesterol 135, triglycerides 176, HDL 54, LDL 52, TSH 4.31  Other Studies Reviewed Today:  Lexiscan Myoview 01/21/2016: No diagnostic ST segment changes to indicate ischemia. No significant myocardial perfusion defects to indicate scar or ischemia. This is a low risk study. Nuclear stress EF: 58%.  Assessment and Plan:  1.  CAD with known occlusion of Ricky RCA associated with collaterals.  We continue medical therapy in Ricky absence of angina or change in stamina.  He continues to do well.  Continue aspirin, Norvasc, Lotensin, Toprol-XL, and Lipitor.  2.  Mixed hyperlipidemia, recent LDL 52, triglycerides mildly elevated at 176.  Continue Lipitor and omega-3 supplements.  3.  Essential hypertension, systolic in Ricky 947M today.  Continue diet and exercise, no change in current antihypertensive regimen.  He is following with Dr. Woody Seller.  Medication Adjustments/Labs and Tests Ordered: Current medicines are reviewed at length with Ricky patient today.  Concerns regarding medicines are outlined above.   Tests Ordered: No orders of Ricky defined types were placed in this encounter.   Medication Changes: No orders of Ricky defined types were placed in this encounter.   Disposition:  Follow up  6 months.  Signed, Satira Sark, MD, [facility] 12/27/2021 8:39 AM    [place] [facility] [place], [place], [place] [address] Phone: [phone]; Fax: [phone]

## 2021-12-27 ENCOUNTER — Encounter: Payer: Self-pay | Admitting: Cardiology

## 2021-12-27 ENCOUNTER — Ambulatory Visit: Payer: Medicare Other | Attending: Cardiology | Admitting: Cardiology

## 2021-12-27 VITALS — BP 138/70 | HR 67 | Ht 71.0 in | Wt 231.0 lb

## 2021-12-27 DIAGNOSIS — I25119 Atherosclerotic heart disease of native coronary artery with unspecified angina pectoris: Secondary | ICD-10-CM

## 2021-12-27 DIAGNOSIS — I10 Essential (primary) hypertension: Secondary | ICD-10-CM | POA: Diagnosis not present

## 2021-12-27 DIAGNOSIS — E782 Mixed hyperlipidemia: Secondary | ICD-10-CM

## 2021-12-27 NOTE — Patient Instructions (Addendum)

## 2022-01-24 DIAGNOSIS — I10 Essential (primary) hypertension: Secondary | ICD-10-CM | POA: Diagnosis not present

## 2022-01-27 ENCOUNTER — Ambulatory Visit: Payer: Self-pay | Admitting: Surgery

## 2022-01-27 DIAGNOSIS — K429 Umbilical hernia without obstruction or gangrene: Secondary | ICD-10-CM | POA: Diagnosis not present

## 2022-01-27 NOTE — H&P (Signed)
Subjective   Chief Complaint: Umbilical Hernia   Cardiology - Dr. Rozann Lesches  History of Present Illness: Ricky Wilson is a 66 y.o. male who is seen today as an office consultation at the request of Dr. Woody Seller for evaluation of Umbilical Hernia .    This is a 66 year old male who presents with a 1 year history of a protruding umbilical hernia.  This has remained reducible.  It has become larger and more noticeable.  There is some associated discomfort.  He denies any GI obstructive symptoms.  No recent imaging.  He mentioned this to his primary care physician who referred him to [place] for surgical repair.   Review of Systems: A complete review of systems was obtained from the patient.  I have reviewed this information and discussed as appropriate with the patient.  See HPI as well for other ROS.  Review of Systems  Constitutional: Negative.   HENT:  Positive for tinnitus.   Eyes: Negative.   Respiratory: Negative.    Cardiovascular: Negative.   Gastrointestinal: Negative.   Genitourinary: Negative.   Musculoskeletal: Negative.   Skin: Negative.   Neurological: Negative.   Endo/Heme/Allergies: Negative.   Psychiatric/Behavioral: Negative.        Medical History: Past Medical History:  Diagnosis Date   Arthritis    GERD (gastroesophageal reflux disease)    Hyperlipidemia    Hypertension     Patient Active Problem List  Diagnosis   Umbilical hernia without obstruction or gangrene   Coronary atherosclerosis of native coronary artery   Essential hypertension, benign   Hyperparathyroidism, primary (CMS-HCC)   Plantar fasciitis of right foot   Mixed hyperlipidemia    Past Surgical History:  Procedure Laterality Date   knee surgery   05/24/2005   JOINT REPLACEMENT     PARATHYROIDECTOMY     2020     Allergies  Allergen Reactions   Hydrocodone Itching    "makes me feel jittery"    Current Outpatient Medications on File Prior to Visit  Medication Sig Dispense  Refill   amLODIPine (NORVASC) 5 MG tablet Take 5 mg by mouth once daily     aspirin 81 MG chewable tablet Take by mouth     benazepriL (LOTENSIN) 10 MG tablet      Febuxostat (ULORIC) 40 mg tablet Take 40 mg by mouth once daily     metoprolol tartrate (LOPRESSOR) 25 MG tablet Take by mouth     multivitamin tablet Take 1 tablet by mouth once daily     omega-3 fatty acids/fish oil (FISH OIL) 340-1,000 mg capsule Take 1 capsule by mouth once daily     No current facility-administered medications on file prior to visit.    Family History  Problem Relation Age of Onset   High blood pressure (Hypertension) Father    Coronary Artery Disease (Blocked arteries around heart) Father    High blood pressure (Hypertension) Sister    Skin cancer Other    Stroke Other    Coronary Artery Disease (Blocked arteries around heart) Other      Social History   Tobacco Use  Smoking Status Never  Smokeless Tobacco Never     Social History   Socioeconomic History   Marital status: Married  Tobacco Use   Smoking status: Never   Smokeless tobacco: Never  Vaping Use   Vaping Use: Never used  Substance and Sexual Activity   Alcohol use: Never   Drug use: Never    Objective:    Vitals:  01/27/22 0956  BP: 126/72  Pulse: 85  Temp: 36.9 C (98.5 F)  SpO2: 98%  Weight: (!) 104.2 kg (229 lb 12.8 oz)  Height: 180.3 cm ('5\' 11"'$ )    Body mass index is 32.05 kg/m.  Physical Exam   Constitutional:  WDWN in NAD, conversant, no obvious deformities; lying in bed comfortably Eyes:  Pupils equal, round; sclera anicteric; moist conjunctiva; no lid lag HENT:  Oral mucosa moist; good dentition  Neck:  No masses palpated, trachea midline; no thyromegaly Lungs:  CTA bilaterally; normal respiratory effort CV:  Regular rate and rhythm; no murmurs; extremities well-perfused with no edema Abd:  +bowel sounds, soft, non-tender, no palpable organomegaly; protruding umbilical hernia at the upper edge of  the umbilicus.  This remains reducible.  The fascial defect is approximately 1.5 cm in diameter. Musc:  Unable to assess gait; no apparent clubbing or cyanosis in extremities Lymphatic:  No palpable cervical or axillary lymphadenopathy Skin:  Warm, dry; no sign of jaundice Psychiatric - alert and oriented x 4; calm mood and affect    Assessment and Plan:  Diagnoses and all orders for this visit:  Umbilical hernia without obstruction or gangrene     Recommend umbilical hernia repair with mesh.  The surgical procedure has been discussed with the patient.  Potential risks, benefits, alternative treatments, and expected outcomes have been explained.  All of the patient's questions at this time have been answered.  The likelihood of reaching the patient's treatment goal is good.  The patient understand the proposed surgical procedure and wishes to proceed.  Cardiac clearance  No follow-ups on file.  Erla Bacchi Jearld Adjutant, MD  01/27/2022 10:12 AM

## 2022-01-28 ENCOUNTER — Telehealth: Payer: Self-pay | Admitting: Cardiology

## 2022-01-28 NOTE — Telephone Encounter (Signed)
   Name: Ricky Wilson  DOB: 1955-11-07  MRN: [id number]   Primary Cardiologist: Rozann Lesches, MD  Chart reviewed as part of pre-operative protocol coverage. Patient was contacted 01/28/2022 in reference to pre-operative risk assessment for pending surgery as outlined below.  Ricky Wilson was last seen on 12/27/2021 by Dr. Domenic Wilson.  Since that day, Ricky Wilson has done well. He denies any new symptoms or concerns.  He is able to complete greater than 4 METS without difficulty.  Therefore, based on ACC/AHA guidelines, the patient would be at acceptable risk for the planned procedure without further cardiovascular testing.   The patient was advised that if he develops new symptoms prior to surgery to contact our office to arrange for a follow-up visit, and he verbalized understanding.  Per office protocol,he may hold aspirin for 5-7 days prior to procedure. Please resume aspirin as soon as possible postprocedure, at the discretion of the surgeon.   I will route this recommendation to the requesting party via Epic fax function and remove from pre-op pool. Please call with questions.  Lenna Sciara, NP 01/28/2022, 3:44 PM

## 2022-01-28 NOTE — Telephone Encounter (Signed)
   Pre-operative Risk Assessment    Patient Name: Ricky Wilson  DOB: 04-11-1955 MRN: [id number]      Request for Surgical Clearance    Procedure:   HERNIA SURGERY  Date of Surgery:  Clearance TBD                             \     Surgeon:  N/A Surgeon's Group or Practice Name:  [facility] Phone number:  [phone]  Fax number:  [phone]   Type of Clearance Requested:   - Pharmacy:  Hold Aspirin     Type of Anesthesia:  Not Indicated   Additional requests/questions:  Please advise surgeon/provider what medications should be held.  Signed, Desma Paganini   01/28/2022, 2:49 PM

## 2022-02-01 ENCOUNTER — Telehealth: Payer: Self-pay | Admitting: Cardiology

## 2022-02-01 NOTE — Telephone Encounter (Signed)
     Primary Cardiologist: Rozann Lesches, MD  Chart reviewed as part of pre-operative protocol coverage. Given past medical history and time since last visit, based on ACC/AHA guidelines, Keagen Heinlen would be at acceptable risk for the planned procedure without further cardiovascular testing.   His RCRI is a class I risk, 0.4% risk of major cardiac event.  During his recent clinic visit he was able to complete greater than 4 METS of physical activity.  If necessary his aspirin may be held for 5 to 7 days prior to his procedure.  Please resume as soon as hemostasis is achieved.  I will route this recommendation to the requesting party via Epic fax function and remove from pre-op pool.  Please call with questions.  Jossie Ng. Elmire Amrein NP-C     02/01/2022, 8:52 AM [facility] [address] Office [phone] Fax [phone]

## 2022-02-01 NOTE — Telephone Encounter (Signed)
   Pre-operative Risk Assessment    Patient Name: Ricky Wilson  DOB: April 15, 1955 MRN: [id number]      Request for Surgical Clearance    Procedure:   Hernia Surgery   Date of Surgery:  Clearance TBD                                 Surgeon:  Dr. Georgette Dover  Surgeon's Group or Practice Name:  [facility]  Phone number:  [phone] Fax number:  [phone]   Type of Clearance Requested:   - Pharmacy:  Hold Aspirin ?   Type of Anesthesia:  General    Additional requests/questions:    Sandrea Hammond   02/01/2022, 8:35 AM

## 2022-02-23 DIAGNOSIS — I10 Essential (primary) hypertension: Secondary | ICD-10-CM | POA: Diagnosis not present

## 2022-03-01 DIAGNOSIS — K420 Umbilical hernia with obstruction, without gangrene: Secondary | ICD-10-CM | POA: Diagnosis not present

## 2022-03-07 DIAGNOSIS — L570 Actinic keratosis: Secondary | ICD-10-CM | POA: Diagnosis not present

## 2022-03-14 DIAGNOSIS — U071 COVID-19: Secondary | ICD-10-CM | POA: Diagnosis not present

## 2022-03-14 DIAGNOSIS — R509 Fever, unspecified: Secondary | ICD-10-CM | POA: Diagnosis not present

## 2022-03-25 DIAGNOSIS — I10 Essential (primary) hypertension: Secondary | ICD-10-CM | POA: Diagnosis not present

## 2022-03-29 DIAGNOSIS — H9312 Tinnitus, left ear: Secondary | ICD-10-CM | POA: Diagnosis not present

## 2022-03-29 DIAGNOSIS — H6992 Unspecified Eustachian tube disorder, left ear: Secondary | ICD-10-CM | POA: Diagnosis not present

## 2022-03-29 DIAGNOSIS — Z299 Encounter for prophylactic measures, unspecified: Secondary | ICD-10-CM | POA: Diagnosis not present

## 2022-03-29 DIAGNOSIS — I10 Essential (primary) hypertension: Secondary | ICD-10-CM | POA: Diagnosis not present

## 2022-04-01 DIAGNOSIS — K429 Umbilical hernia without obstruction or gangrene: Secondary | ICD-10-CM | POA: Diagnosis not present

## 2022-04-25 DIAGNOSIS — I10 Essential (primary) hypertension: Secondary | ICD-10-CM | POA: Diagnosis not present

## 2022-05-25 DIAGNOSIS — I10 Essential (primary) hypertension: Secondary | ICD-10-CM | POA: Diagnosis not present

## 2022-06-07 DIAGNOSIS — S335XXA Sprain of ligaments of lumbar spine, initial encounter: Secondary | ICD-10-CM | POA: Diagnosis not present

## 2022-06-07 DIAGNOSIS — M9902 Segmental and somatic dysfunction of thoracic region: Secondary | ICD-10-CM | POA: Diagnosis not present

## 2022-06-07 DIAGNOSIS — M9903 Segmental and somatic dysfunction of lumbar region: Secondary | ICD-10-CM | POA: Diagnosis not present

## 2022-06-07 DIAGNOSIS — S233XXA Sprain of ligaments of thoracic spine, initial encounter: Secondary | ICD-10-CM | POA: Diagnosis not present

## 2022-06-07 DIAGNOSIS — M47816 Spondylosis without myelopathy or radiculopathy, lumbar region: Secondary | ICD-10-CM | POA: Diagnosis not present

## 2022-06-08 DIAGNOSIS — I10 Essential (primary) hypertension: Secondary | ICD-10-CM | POA: Diagnosis not present

## 2022-06-08 DIAGNOSIS — Z7189 Other specified counseling: Secondary | ICD-10-CM | POA: Diagnosis not present

## 2022-06-08 DIAGNOSIS — Z0000 Encounter for general adult medical examination without abnormal findings: Secondary | ICD-10-CM | POA: Diagnosis not present

## 2022-06-08 DIAGNOSIS — Z299 Encounter for prophylactic measures, unspecified: Secondary | ICD-10-CM | POA: Diagnosis not present

## 2022-06-25 DIAGNOSIS — I10 Essential (primary) hypertension: Secondary | ICD-10-CM | POA: Diagnosis not present

## 2022-07-06 DIAGNOSIS — H73892 Other specified disorders of tympanic membrane, left ear: Secondary | ICD-10-CM | POA: Diagnosis not present

## 2022-07-06 DIAGNOSIS — H9193 Unspecified hearing loss, bilateral: Secondary | ICD-10-CM | POA: Diagnosis not present

## 2022-07-06 DIAGNOSIS — H9312 Tinnitus, left ear: Secondary | ICD-10-CM | POA: Diagnosis not present

## 2022-07-06 DIAGNOSIS — H9202 Otalgia, left ear: Secondary | ICD-10-CM | POA: Diagnosis not present

## 2022-07-06 DIAGNOSIS — H903 Sensorineural hearing loss, bilateral: Secondary | ICD-10-CM | POA: Diagnosis not present

## 2022-07-06 DIAGNOSIS — H9313 Tinnitus, bilateral: Secondary | ICD-10-CM | POA: Diagnosis not present

## 2022-07-15 DIAGNOSIS — H524 Presbyopia: Secondary | ICD-10-CM | POA: Diagnosis not present

## 2022-07-15 DIAGNOSIS — H35371 Puckering of macula, right eye: Secondary | ICD-10-CM | POA: Diagnosis not present

## 2022-07-25 ENCOUNTER — Other Ambulatory Visit: Payer: Self-pay | Admitting: Cardiology

## 2022-07-25 DIAGNOSIS — I10 Essential (primary) hypertension: Secondary | ICD-10-CM | POA: Diagnosis not present

## 2022-07-25 DIAGNOSIS — D492 Neoplasm of unspecified behavior of bone, soft tissue, and skin: Secondary | ICD-10-CM | POA: Diagnosis not present

## 2022-07-25 DIAGNOSIS — Z299 Encounter for prophylactic measures, unspecified: Secondary | ICD-10-CM | POA: Diagnosis not present

## 2022-07-26 DIAGNOSIS — I10 Essential (primary) hypertension: Secondary | ICD-10-CM | POA: Diagnosis not present

## 2022-07-26 NOTE — Progress Notes (Unsigned)
    Cardiology Office Note  Date: 07/27/2022   ID: [facility] [place], [place] 01-14-1956, MRN [id number]  History of Present Illness: Juda Toepfer is a 67 y.o. male last seen in October 2023.  He is here for a routine visit, reports no angina or change in stamina.  His wife had knee replacement and he has been her primary caregiver over the last several weeks, just now started to get back to the gym.  I reviewed his medications, he reports compliance with therapy.  He is due for lab work with PCP in the next month.  His LDL last year was 52 on Lipitor.  ECG today shows sinus bradycardia.  Physical Exam: VS:  BP 136/84   Pulse 61   Ht 5\' 11"  (1.803 m)   Wt 223 lb 3.2 oz (101.2 kg)   SpO2 94%   BMI 31.13 kg/m , BMI Body mass index is 31.13 kg/m.  Wt Readings from Last 3 Encounters:  07/27/22 223 lb 3.2 oz (101.2 kg)  12/27/21 231 lb (104.8 kg)  06/25/21 225 lb (102.1 kg)    General: Patient appears comfortable at rest. HEENT: Conjunctiva and lids normal. Neck: Supple, no elevated JVP or carotid bruits. Lungs: Clear to auscultation, nonlabored breathing at rest. Cardiac: Regular rate and rhythm, no S3, midsystolic click at apex without murmur. Extremities: No pitting edema.  ECG:  An ECG dated 06/25/2021 was personally reviewed today and demonstrated:  Sinus rhythm.  Labwork:  April 2023: Hemoglobin 14.3, platelets 169, BUN 17, creatinine 1.3, potassium 4.3, AST 18, ALT 24, cholesterol 135, triglycerides 176, HDL 54, LDL 52, TSH 4.31  Other Studies Reviewed Today:  No interval cardiac testing for review today.  Assessment and Plan:  1.  CAD with known occlusion of the RCA associated with collaterals as of 2004.  Ischemic testing via Lexiscan Myoview in 2017 showed no significant scar or ischemia, low risk with LVEF 58%.  We have discussed warning signs and symptoms, he continues to do well at this point on medical therapy.  Continue aspirin, Norvasc, Toprol-XL, and  Lipitor.  2.  Essential hypertension.  No changes made to present regimen.  Keep follow-up with PCP.  3.  Mixed hyperlipidemia.  He has done well on Lipitor, LDL was 52 last year.  Follow-up lab work pending with PCP.  Disposition:  Follow up  6 months.  Signed, Jonelle Sidle, M.D., F.A.C.C. [place] [facility] at [facility]

## 2022-07-27 ENCOUNTER — Ambulatory Visit: Payer: Medicare Other | Attending: Cardiology | Admitting: Cardiology

## 2022-07-27 ENCOUNTER — Encounter: Payer: Self-pay | Admitting: Cardiology

## 2022-07-27 VITALS — BP 136/84 | HR 61 | Ht 71.0 in | Wt 223.2 lb

## 2022-07-27 DIAGNOSIS — I10 Essential (primary) hypertension: Secondary | ICD-10-CM

## 2022-07-27 DIAGNOSIS — I25119 Atherosclerotic heart disease of native coronary artery with unspecified angina pectoris: Secondary | ICD-10-CM | POA: Diagnosis not present

## 2022-07-27 DIAGNOSIS — E782 Mixed hyperlipidemia: Secondary | ICD-10-CM

## 2022-07-27 NOTE — Patient Instructions (Addendum)

## 2022-08-26 DIAGNOSIS — I10 Essential (primary) hypertension: Secondary | ICD-10-CM | POA: Diagnosis not present

## 2022-09-25 DIAGNOSIS — I10 Essential (primary) hypertension: Secondary | ICD-10-CM | POA: Diagnosis not present

## 2022-10-13 DIAGNOSIS — Z299 Encounter for prophylactic measures, unspecified: Secondary | ICD-10-CM | POA: Diagnosis not present

## 2022-10-13 DIAGNOSIS — E7800 Pure hypercholesterolemia, unspecified: Secondary | ICD-10-CM | POA: Diagnosis not present

## 2022-10-13 DIAGNOSIS — Z0000 Encounter for general adult medical examination without abnormal findings: Secondary | ICD-10-CM | POA: Diagnosis not present

## 2022-10-13 DIAGNOSIS — R5383 Other fatigue: Secondary | ICD-10-CM | POA: Diagnosis not present

## 2022-10-13 DIAGNOSIS — Z79899 Other long term (current) drug therapy: Secondary | ICD-10-CM | POA: Diagnosis not present

## 2022-10-13 DIAGNOSIS — E039 Hypothyroidism, unspecified: Secondary | ICD-10-CM | POA: Diagnosis not present

## 2022-10-13 DIAGNOSIS — I10 Essential (primary) hypertension: Secondary | ICD-10-CM | POA: Diagnosis not present

## 2022-10-19 DIAGNOSIS — M47816 Spondylosis without myelopathy or radiculopathy, lumbar region: Secondary | ICD-10-CM | POA: Diagnosis not present

## 2022-10-19 DIAGNOSIS — S335XXA Sprain of ligaments of lumbar spine, initial encounter: Secondary | ICD-10-CM | POA: Diagnosis not present

## 2022-10-19 DIAGNOSIS — S233XXA Sprain of ligaments of thoracic spine, initial encounter: Secondary | ICD-10-CM | POA: Diagnosis not present

## 2022-10-19 DIAGNOSIS — M9902 Segmental and somatic dysfunction of thoracic region: Secondary | ICD-10-CM | POA: Diagnosis not present

## 2022-10-19 DIAGNOSIS — M9903 Segmental and somatic dysfunction of lumbar region: Secondary | ICD-10-CM | POA: Diagnosis not present

## 2022-10-26 DIAGNOSIS — I10 Essential (primary) hypertension: Secondary | ICD-10-CM | POA: Diagnosis not present

## 2022-10-26 DIAGNOSIS — M47816 Spondylosis without myelopathy or radiculopathy, lumbar region: Secondary | ICD-10-CM | POA: Diagnosis not present

## 2022-10-26 DIAGNOSIS — M9903 Segmental and somatic dysfunction of lumbar region: Secondary | ICD-10-CM | POA: Diagnosis not present

## 2022-10-26 DIAGNOSIS — S335XXA Sprain of ligaments of lumbar spine, initial encounter: Secondary | ICD-10-CM | POA: Diagnosis not present

## 2022-10-26 DIAGNOSIS — M9902 Segmental and somatic dysfunction of thoracic region: Secondary | ICD-10-CM | POA: Diagnosis not present

## 2022-10-26 DIAGNOSIS — S233XXA Sprain of ligaments of thoracic spine, initial encounter: Secondary | ICD-10-CM | POA: Diagnosis not present

## 2022-11-04 DIAGNOSIS — S335XXA Sprain of ligaments of lumbar spine, initial encounter: Secondary | ICD-10-CM | POA: Diagnosis not present

## 2022-11-04 DIAGNOSIS — M9902 Segmental and somatic dysfunction of thoracic region: Secondary | ICD-10-CM | POA: Diagnosis not present

## 2022-11-04 DIAGNOSIS — M9903 Segmental and somatic dysfunction of lumbar region: Secondary | ICD-10-CM | POA: Diagnosis not present

## 2022-11-04 DIAGNOSIS — S233XXA Sprain of ligaments of thoracic spine, initial encounter: Secondary | ICD-10-CM | POA: Diagnosis not present

## 2022-11-04 DIAGNOSIS — M47816 Spondylosis without myelopathy or radiculopathy, lumbar region: Secondary | ICD-10-CM | POA: Diagnosis not present

## 2022-11-11 DIAGNOSIS — S335XXA Sprain of ligaments of lumbar spine, initial encounter: Secondary | ICD-10-CM | POA: Diagnosis not present

## 2022-11-11 DIAGNOSIS — M9902 Segmental and somatic dysfunction of thoracic region: Secondary | ICD-10-CM | POA: Diagnosis not present

## 2022-11-11 DIAGNOSIS — M47816 Spondylosis without myelopathy or radiculopathy, lumbar region: Secondary | ICD-10-CM | POA: Diagnosis not present

## 2022-11-11 DIAGNOSIS — M9903 Segmental and somatic dysfunction of lumbar region: Secondary | ICD-10-CM | POA: Diagnosis not present

## 2022-11-11 DIAGNOSIS — S233XXA Sprain of ligaments of thoracic spine, initial encounter: Secondary | ICD-10-CM | POA: Diagnosis not present

## 2022-11-26 DIAGNOSIS — I10 Essential (primary) hypertension: Secondary | ICD-10-CM | POA: Diagnosis not present

## 2022-12-06 DIAGNOSIS — S233XXA Sprain of ligaments of thoracic spine, initial encounter: Secondary | ICD-10-CM | POA: Diagnosis not present

## 2022-12-06 DIAGNOSIS — S335XXA Sprain of ligaments of lumbar spine, initial encounter: Secondary | ICD-10-CM | POA: Diagnosis not present

## 2022-12-06 DIAGNOSIS — M9902 Segmental and somatic dysfunction of thoracic region: Secondary | ICD-10-CM | POA: Diagnosis not present

## 2022-12-06 DIAGNOSIS — M9903 Segmental and somatic dysfunction of lumbar region: Secondary | ICD-10-CM | POA: Diagnosis not present

## 2022-12-06 DIAGNOSIS — M47816 Spondylosis without myelopathy or radiculopathy, lumbar region: Secondary | ICD-10-CM | POA: Diagnosis not present

## 2022-12-12 DIAGNOSIS — S335XXA Sprain of ligaments of lumbar spine, initial encounter: Secondary | ICD-10-CM | POA: Diagnosis not present

## 2022-12-12 DIAGNOSIS — M9903 Segmental and somatic dysfunction of lumbar region: Secondary | ICD-10-CM | POA: Diagnosis not present

## 2022-12-12 DIAGNOSIS — S233XXA Sprain of ligaments of thoracic spine, initial encounter: Secondary | ICD-10-CM | POA: Diagnosis not present

## 2022-12-12 DIAGNOSIS — M9902 Segmental and somatic dysfunction of thoracic region: Secondary | ICD-10-CM | POA: Diagnosis not present

## 2022-12-12 DIAGNOSIS — M47816 Spondylosis without myelopathy or radiculopathy, lumbar region: Secondary | ICD-10-CM | POA: Diagnosis not present

## 2022-12-26 DIAGNOSIS — I10 Essential (primary) hypertension: Secondary | ICD-10-CM | POA: Diagnosis not present

## 2023-01-20 ENCOUNTER — Other Ambulatory Visit: Payer: Self-pay | Admitting: Cardiology

## 2023-01-25 DIAGNOSIS — I10 Essential (primary) hypertension: Secondary | ICD-10-CM | POA: Diagnosis not present

## 2023-01-31 ENCOUNTER — Ambulatory Visit: Payer: Medicare Other | Attending: Cardiology | Admitting: Cardiology

## 2023-01-31 ENCOUNTER — Telehealth: Payer: Self-pay | Admitting: Cardiology

## 2023-01-31 ENCOUNTER — Encounter: Payer: Self-pay | Admitting: *Deleted

## 2023-01-31 ENCOUNTER — Encounter: Payer: Self-pay | Admitting: Cardiology

## 2023-01-31 VITALS — BP 142/88 | HR 70 | Ht 71.0 in | Wt 235.8 lb

## 2023-01-31 DIAGNOSIS — E782 Mixed hyperlipidemia: Secondary | ICD-10-CM | POA: Diagnosis not present

## 2023-01-31 DIAGNOSIS — I25119 Atherosclerotic heart disease of native coronary artery with unspecified angina pectoris: Secondary | ICD-10-CM

## 2023-01-31 DIAGNOSIS — I10 Essential (primary) hypertension: Secondary | ICD-10-CM | POA: Diagnosis not present

## 2023-01-31 NOTE — Patient Instructions (Addendum)
Medication Instructions:  Your physician recommends that you continue on your current medications as directed. Please refer to the Current Medication list given to you today.  Labwork: none  Testing/Procedures: Your physician has requested that you have a lexiscan myoview. For further information please visit [email or web address] Please follow instruction sheet, as given.  Follow-Up: Your physician recommends that you schedule a follow-up appointment in: 6 months  Any Other Special Instructions Will Be Listed Below (If Applicable). Your physician has requested that you regularly monitor and record your blood pressure readings at home. Please use the same machine at the same time of day to check your readings and record them.   If you need a refill on your cardiac medications before your next appointment, please call your pharmacy.

## 2023-01-31 NOTE — Progress Notes (Signed)
    Cardiology Office Note  Date: 01/31/2023   ID: [facility] [place], [place] [birth year], MRN [id number]  History of Present Illness: Ricky Wilson is a 67 y.o. male last seen in May.  He is here for a routine visit.  States that he continues to exercise at the gym 5 days a week, walks on the treadmill 30 minutes at a time without symptoms.  With higher levels of activity he does experience intermittent angina.  Describes symptoms after walking up 65 steps at a football stadium.  Otherwise NYHA class I-II dyspnea.  He has gained weight since last encounter.  I reviewed his medications.  Current cardiovascular regimen includes aspirin, Norvasc, Lipitor, Lotensin, Toprol-XL, and omega-3 supplements.  I rechecked his blood pressure today at 142/88 in the right arm.  He has had some elevations at home, but not consistently.  I reviewed his interval lab work, LDL was 63 in July.  Physical Exam: VS:  BP (!) 142/88 (BP Location: Right Arm)   Pulse 70   Ht 5\' 11"  (1.803 m)   Wt 235 lb 12.8 oz (107 kg)   SpO2 94%   BMI 32.89 kg/m , BMI Body mass index is 32.89 kg/m.  Wt Readings from Last 3 Encounters:  01/31/23 235 lb 12.8 oz (107 kg)  07/27/22 223 lb 3.2 oz (101.2 kg)  12/27/21 231 lb (104.8 kg)    General: Patient appears comfortable at rest. HEENT: Conjunctiva and lids normal. Lungs: Clear to auscultation, nonlabored breathing at rest. Cardiac: Regular rate and rhythm, no S3 or significant systolic murmur, no pericardial rub. Abdomen: Soft, bowel sounds present, no bruits. Extremities: No pitting edema.  ECG:  An ECG dated 07/27/2022 was personally reviewed today and demonstrated:  Sinus bradycardia.  Labwork:  July 2024: Hemoglobin 14.1, platelets 151, BUN 12, creatinine 1.18, potassium 4.5, AST 33, ALT 41, cholesterol 128, triglycerides 154, HDL 38, LDL 63, TSH 3.7  Other Studies Reviewed Today:  No interval cardiac testing for review today.  Assessment and Plan:  1.   CAD with known occlusion of the RCA associated with collaterals as of 2004.  Ischemic testing via Lexiscan Myoview in 2017 showed no significant scar or ischemia, low risk with LVEF 58%.  He does report effort angina with higher level of activity.  Otherwise has continued a regular exercise plan.  Has also gained weight since last visit.  Plan is to update ischemic testing, we will arrange a Lexiscan Myoview.  Otherwise continue aspirin and Lipitor.   2.  Primary hypertension.  Blood pressure trending up.  We discussed weight loss, he will continue to track home blood pressure as well.  Continue Norvasc, Lotensin, and Toprol-XL.  Could consider increasing Norvasc if necessary.   3.  Mixed hyperlipidemia.  LDL 63 in July.  He continues on Lipitor.  Disposition:  Follow up  test results.  Signed, Jonelle Sidle, M.D., F.A.C.C.  [facility] at [facility]

## 2023-01-31 NOTE — Telephone Encounter (Signed)
Checking percert on the following patient for testing scheduled at [facility].    Lexiscan 02/14/2023

## 2023-02-13 DIAGNOSIS — I10 Essential (primary) hypertension: Secondary | ICD-10-CM | POA: Diagnosis not present

## 2023-02-13 DIAGNOSIS — H6690 Otitis media, unspecified, unspecified ear: Secondary | ICD-10-CM | POA: Diagnosis not present

## 2023-02-13 DIAGNOSIS — Z299 Encounter for prophylactic measures, unspecified: Secondary | ICD-10-CM | POA: Diagnosis not present

## 2023-02-13 DIAGNOSIS — R52 Pain, unspecified: Secondary | ICD-10-CM | POA: Diagnosis not present

## 2023-02-13 DIAGNOSIS — H9203 Otalgia, bilateral: Secondary | ICD-10-CM | POA: Diagnosis not present

## 2023-02-14 ENCOUNTER — Ambulatory Visit (HOSPITAL_COMMUNITY)
Admission: RE | Admit: 2023-02-14 | Discharge: 2023-02-14 | Disposition: A | Discharge status: Home / Self Care | Payer: Medicare Other | Source: Ambulatory Visit | Attending: Internal Medicine | Admitting: Internal Medicine

## 2023-02-14 ENCOUNTER — Encounter (HOSPITAL_COMMUNITY): Payer: Self-pay

## 2023-02-14 ENCOUNTER — Encounter (HOSPITAL_COMMUNITY)
Admission: RE | Admit: 2023-02-14 | Discharge: 2023-02-14 | Disposition: A | Discharge status: Home / Self Care | Payer: Medicare Other | Source: Ambulatory Visit | Attending: Cardiology | Admitting: Cardiology

## 2023-02-14 DIAGNOSIS — I25119 Atherosclerotic heart disease of native coronary artery with unspecified angina pectoris: Secondary | ICD-10-CM | POA: Diagnosis not present

## 2023-02-14 LAB — NM MYOCAR MULTI W/SPECT W/WALL MOTION / EF
Base ST Depression (mm): 0 mm
LV dias vol: 115 mL (ref 62–150)
LV sys vol: 37 mL
Nuc Stress EF: 68 %
Peak HR: 86 {beats}/min
RATE: 0.5
Rest HR: 57 {beats}/min
Rest Nuclear Isotope Dose: 11 mCi
SDS: 1
SRS: 0
SSS: 1
ST Depression (mm): 0 mm
Stress Nuclear Isotope Dose: 33 mCi
TID: 0.85

## 2023-02-14 MED ORDER — REGADENOSON 0.4 MG/5ML IV SOLN
INTRAVENOUS | Status: AC
  Administered 2023-02-14: 0.4 mg via INTRAVENOUS
  Filled 2023-02-14: qty 5

## 2023-02-14 MED ORDER — TECHNETIUM TC 99M TETROFOSMIN IV KIT
30.0000 | PACK | Freq: Once | INTRAVENOUS | Status: AC | PRN
Start: 2023-02-14 — End: 2023-02-14
  Administered 2023-02-14: 33 via INTRAVENOUS

## 2023-02-14 MED ORDER — SODIUM CHLORIDE FLUSH 0.9 % IV SOLN
INTRAVENOUS | Status: AC
  Administered 2023-02-14: 10 mL via INTRAVENOUS
  Filled 2023-02-14: qty 10

## 2023-02-14 MED ORDER — TECHNETIUM TC 99M TETROFOSMIN IV KIT
10.0000 | PACK | Freq: Once | INTRAVENOUS | Status: AC | PRN
  Administered 2023-02-14: 11 via INTRAVENOUS

## 2023-02-24 DIAGNOSIS — I10 Essential (primary) hypertension: Secondary | ICD-10-CM | POA: Diagnosis not present

## 2023-03-08 DIAGNOSIS — L570 Actinic keratosis: Secondary | ICD-10-CM | POA: Diagnosis not present

## 2023-03-08 DIAGNOSIS — D1730 Benign lipomatous neoplasm of skin and subcutaneous tissue of unspecified sites: Secondary | ICD-10-CM | POA: Diagnosis not present

## 2023-03-13 DIAGNOSIS — M10072 Idiopathic gout, left ankle and foot: Secondary | ICD-10-CM | POA: Diagnosis not present

## 2023-03-13 DIAGNOSIS — I10 Essential (primary) hypertension: Secondary | ICD-10-CM | POA: Diagnosis not present

## 2023-03-27 DIAGNOSIS — I10 Essential (primary) hypertension: Secondary | ICD-10-CM | POA: Diagnosis not present

## 2023-04-26 DIAGNOSIS — I10 Essential (primary) hypertension: Secondary | ICD-10-CM | POA: Diagnosis not present

## 2023-05-26 DIAGNOSIS — I10 Essential (primary) hypertension: Secondary | ICD-10-CM | POA: Diagnosis not present

## 2023-06-16 DIAGNOSIS — Z7189 Other specified counseling: Secondary | ICD-10-CM | POA: Diagnosis not present

## 2023-06-16 DIAGNOSIS — Z299 Encounter for prophylactic measures, unspecified: Secondary | ICD-10-CM | POA: Diagnosis not present

## 2023-06-16 DIAGNOSIS — E039 Hypothyroidism, unspecified: Secondary | ICD-10-CM | POA: Diagnosis not present

## 2023-06-16 DIAGNOSIS — I25119 Atherosclerotic heart disease of native coronary artery with unspecified angina pectoris: Secondary | ICD-10-CM | POA: Diagnosis not present

## 2023-06-16 DIAGNOSIS — I10 Essential (primary) hypertension: Secondary | ICD-10-CM | POA: Diagnosis not present

## 2023-06-16 DIAGNOSIS — Z0000 Encounter for general adult medical examination without abnormal findings: Secondary | ICD-10-CM | POA: Diagnosis not present

## 2023-06-16 DIAGNOSIS — Z713 Dietary counseling and surveillance: Secondary | ICD-10-CM | POA: Diagnosis not present

## 2023-06-25 DIAGNOSIS — I10 Essential (primary) hypertension: Secondary | ICD-10-CM | POA: Diagnosis not present

## 2023-07-12 DIAGNOSIS — Z96612 Presence of left artificial shoulder joint: Secondary | ICD-10-CM | POA: Diagnosis not present

## 2023-07-20 ENCOUNTER — Other Ambulatory Visit: Payer: Self-pay | Admitting: Cardiology

## 2023-07-26 DIAGNOSIS — I10 Essential (primary) hypertension: Secondary | ICD-10-CM | POA: Diagnosis not present

## 2023-08-08 ENCOUNTER — Encounter: Payer: Self-pay | Admitting: Cardiology

## 2023-08-08 ENCOUNTER — Ambulatory Visit: Payer: Medicare Other | Attending: Cardiology | Admitting: Cardiology

## 2023-08-08 VITALS — BP 134/70 | HR 82 | Ht 71.0 in | Wt 232.2 lb

## 2023-08-08 DIAGNOSIS — E782 Mixed hyperlipidemia: Secondary | ICD-10-CM | POA: Diagnosis not present

## 2023-08-08 DIAGNOSIS — I10 Essential (primary) hypertension: Secondary | ICD-10-CM

## 2023-08-08 DIAGNOSIS — I25119 Atherosclerotic heart disease of native coronary artery with unspecified angina pectoris: Secondary | ICD-10-CM

## 2023-08-08 NOTE — Progress Notes (Signed)
    Cardiology Office Note  Date: 08/08/2023   ID: [facility] [place],  [birth year], MRN [id number]  History of Present Illness: Ricky Wilson is a 68 y.o. male last seen in November 2024.  He is here for a routine visit.  He continues to exercise regularly at the gym using weight machines and walking at least a mile on the treadmill.  Reports no symptoms with that activity.  When he does heavier yard work (pushing a [employer] with 50 pounds) he does experience stable angina.  He indicates that this has not increased in frequency or intensity.  We did set up a follow-up Lexiscan  Myoview  last year which was reassuring demonstrating no active ischemia and normal LVEF.  We went over his medications.  He reports compliance with therapy, no drug intolerances.  He will have follow-up lab work with PCP this summer.  I reviewed his ECG today which shows normal sinus rhythm with nonspecific ST changes.  Physical Exam: VS:  BP 134/70 (BP Location: Right Arm, Patient Position: Sitting, Cuff Size: Normal)   Pulse 82   Ht 5\' 11"  (1.803 m)   Wt 232 lb 3.2 oz (105.3 kg)   SpO2 93%   BMI 32.39 kg/m , BMI Body mass index is 32.39 kg/m.  Wt Readings from Last 3 Encounters:  08/08/23 232 lb 3.2 oz (105.3 kg)  01/31/23 235 lb 12.8 oz (107 kg)  07/27/22 223 lb 3.2 oz (101.2 kg)    General: Patient appears comfortable at rest. HEENT: Conjunctiva and lids normal. Neck: Supple, no elevated JVP or carotid bruits. Lungs: Clear to auscultation, nonlabored breathing at rest. Cardiac: Regular rate and rhythm, no S3 or significant systolic murmur. Extremities: No pitting edema.  ECG:  An ECG dated 07/27/2022 was personally reviewed today and demonstrated:  Sinus bradycardia.  Labwork:  July 2024: Hemoglobin 14.1, platelets 151, BUN 12, creatinine 1.18, potassium 4.5, AST 33, ALT 41, cholesterol 128, triglycerides 154, HDL 38, LDL 63, TSH 3.7   Other Studies Reviewed Today:  No interval  cardiac testing for review today.  Assessment and Plan:  1.  CAD with known occlusion of the RCA associated with collaterals as of 2004.  Follow-up Lexiscan  Myoview  in November 2024 showed no significant ischemia with LVEF 68%.  He reports stable angina with highest level of activity, no increasing pattern and otherwise clinically stable.  Continue regular exercise plan and observation.  We have discussed warning signs and symptoms that would prompt more urgent evaluation.  Continue aspirin 81 mg daily and Lipitor 20 mg daily.   2.  Primary hypertension.  Continue current regimen including Norvasc 5 mg daily, Lotensin  20 mg daily, and Toprol -XL 12.5 mg daily.   3.  Mixed hyperlipidemia.  LDL 63 in July 2024.  Continue Lipitor 20 mg daily.  Follow-up lab work with PCP this summer.  Disposition:  Follow up 6 months.  Signed, Gerard Knight, M.D., F.A.C.C. [place] [facility] at [facility]

## 2023-08-08 NOTE — Patient Instructions (Addendum)

## 2023-08-14 ENCOUNTER — Telehealth: Payer: Self-pay | Admitting: Cardiology

## 2023-08-14 NOTE — Telephone Encounter (Signed)
 Ricky Wilson may have ntg 0.4 mg sl prn chest pain, however, he needs to be aware that he is not to use nitrates if he develops symptoms after using sildenafil, which is on his medicine list.

## 2023-08-14 NOTE — Telephone Encounter (Addendum)
 1. Which medications need to be refilled? (please list name of each medication and dose if known)   Nitroglycerin  2. Which pharmacy/location (including street and city if local pharmacy) is medication to be sent to?  [facility]  3. Do they need a 30 day or 90 day supply? 90

## 2023-08-15 MED ORDER — NITROGLYCERIN 0.4 MG SL SUBL
0.4000 mg | SUBLINGUAL_TABLET | SUBLINGUAL | 3 refills | Status: AC | PRN
Start: 2023-08-15 — End: ?

## 2023-08-15 NOTE — Telephone Encounter (Signed)
 Patient notified and verbalized understanding.

## 2023-08-26 DIAGNOSIS — I10 Essential (primary) hypertension: Secondary | ICD-10-CM | POA: Diagnosis not present

## 2023-08-30 DIAGNOSIS — Z299 Encounter for prophylactic measures, unspecified: Secondary | ICD-10-CM | POA: Diagnosis not present

## 2023-08-30 DIAGNOSIS — J069 Acute upper respiratory infection, unspecified: Secondary | ICD-10-CM | POA: Diagnosis not present

## 2023-08-30 DIAGNOSIS — R0981 Nasal congestion: Secondary | ICD-10-CM | POA: Diagnosis not present

## 2023-09-01 DIAGNOSIS — H524 Presbyopia: Secondary | ICD-10-CM | POA: Diagnosis not present

## 2023-09-01 DIAGNOSIS — H40013 Open angle with borderline findings, low risk, bilateral: Secondary | ICD-10-CM | POA: Diagnosis not present

## 2023-09-25 DIAGNOSIS — I10 Essential (primary) hypertension: Secondary | ICD-10-CM | POA: Diagnosis not present

## 2023-10-25 DIAGNOSIS — E7800 Pure hypercholesterolemia, unspecified: Secondary | ICD-10-CM | POA: Diagnosis not present

## 2023-10-25 DIAGNOSIS — E039 Hypothyroidism, unspecified: Secondary | ICD-10-CM | POA: Diagnosis not present

## 2023-10-25 DIAGNOSIS — R5383 Other fatigue: Secondary | ICD-10-CM | POA: Diagnosis not present

## 2023-10-25 DIAGNOSIS — Z79899 Other long term (current) drug therapy: Secondary | ICD-10-CM | POA: Diagnosis not present

## 2023-10-25 DIAGNOSIS — I10 Essential (primary) hypertension: Secondary | ICD-10-CM | POA: Diagnosis not present

## 2023-10-25 DIAGNOSIS — Z0000 Encounter for general adult medical examination without abnormal findings: Secondary | ICD-10-CM | POA: Diagnosis not present

## 2023-10-25 DIAGNOSIS — Z299 Encounter for prophylactic measures, unspecified: Secondary | ICD-10-CM | POA: Diagnosis not present

## 2023-10-26 DIAGNOSIS — I10 Essential (primary) hypertension: Secondary | ICD-10-CM | POA: Diagnosis not present

## 2023-11-09 DIAGNOSIS — Z299 Encounter for prophylactic measures, unspecified: Secondary | ICD-10-CM | POA: Diagnosis not present

## 2023-11-09 DIAGNOSIS — R7301 Impaired fasting glucose: Secondary | ICD-10-CM | POA: Diagnosis not present

## 2023-11-09 DIAGNOSIS — I10 Essential (primary) hypertension: Secondary | ICD-10-CM | POA: Diagnosis not present

## 2023-11-09 DIAGNOSIS — E119 Type 2 diabetes mellitus without complications: Secondary | ICD-10-CM | POA: Diagnosis not present

## 2023-11-25 DIAGNOSIS — E1165 Type 2 diabetes mellitus with hyperglycemia: Secondary | ICD-10-CM | POA: Diagnosis not present

## 2023-11-25 DIAGNOSIS — I10 Essential (primary) hypertension: Secondary | ICD-10-CM | POA: Diagnosis not present

## 2023-12-26 DIAGNOSIS — E1165 Type 2 diabetes mellitus with hyperglycemia: Secondary | ICD-10-CM | POA: Diagnosis not present

## 2023-12-26 DIAGNOSIS — I10 Essential (primary) hypertension: Secondary | ICD-10-CM | POA: Diagnosis not present

## 2024-01-18 ENCOUNTER — Other Ambulatory Visit: Payer: Self-pay | Admitting: Cardiology

## 2024-01-22 MED ORDER — ATORVASTATIN CALCIUM 20 MG PO TABS
20.0000 mg | ORAL_TABLET | Freq: Every day | ORAL | 1 refills | Status: AC
Start: 2024-01-22 — End: ?

## 2024-01-25 ENCOUNTER — Other Ambulatory Visit: Payer: Self-pay | Admitting: Cardiology

## 2024-05-08 ENCOUNTER — Ambulatory Visit: Admitting: Cardiology
